# Patient Record
Sex: Female | Born: 1955 | Race: White | Hispanic: No | Smoking: Current every day smoker
Health system: Southern US, Community
[De-identification: ages and names within clinical notes are randomized; demographics above are authoritative.]

## PROBLEM LIST (undated history)

## (undated) DIAGNOSIS — F419 Anxiety disorder, unspecified: Secondary | ICD-10-CM

## (undated) DIAGNOSIS — K219 Gastro-esophageal reflux disease without esophagitis: Secondary | ICD-10-CM

## (undated) DIAGNOSIS — B182 Chronic viral hepatitis C: Secondary | ICD-10-CM

## (undated) DIAGNOSIS — I10 Essential (primary) hypertension: Secondary | ICD-10-CM

## (undated) DIAGNOSIS — J449 Chronic obstructive pulmonary disease, unspecified: Secondary | ICD-10-CM

## (undated) HISTORY — PX: VAGINAL HYSTERECTOMY: SUR661

## (undated) HISTORY — DX: Anxiety disorder, unspecified: F41.9

## (undated) HISTORY — PX: RECTOCELE REPAIR: SHX761

## (undated) HISTORY — PX: DILATION AND CURETTAGE OF UTERUS: SHX78

## (undated) HISTORY — DX: Chronic obstructive pulmonary disease, unspecified: J44.9

## (undated) HISTORY — DX: Essential (primary) hypertension: I10

## (undated) HISTORY — DX: Chronic viral hepatitis C: B18.2

---

## 2006-06-19 DIAGNOSIS — F419 Anxiety disorder, unspecified: Secondary | ICD-10-CM

## 2006-06-19 HISTORY — DX: Anxiety disorder, unspecified: F41.9

## 2014-04-16 ENCOUNTER — Other Ambulatory Visit (HOSPITAL_COMMUNITY): Payer: Self-pay | Admitting: Physician Assistant

## 2014-04-16 DIAGNOSIS — Z1231 Encounter for screening mammogram for malignant neoplasm of breast: Secondary | ICD-10-CM

## 2014-04-22 ENCOUNTER — Ambulatory Visit (HOSPITAL_COMMUNITY)
Admission: RE | Admit: 2014-04-22 | Discharge: 2014-04-22 | Disposition: A | Discharge status: Home / Self Care | Payer: Self-pay | Source: Ambulatory Visit | Attending: Physician Assistant | Admitting: Physician Assistant

## 2014-04-22 DIAGNOSIS — Z1231 Encounter for screening mammogram for malignant neoplasm of breast: Secondary | ICD-10-CM

## 2014-04-29 ENCOUNTER — Other Ambulatory Visit: Payer: Self-pay | Admitting: Physician Assistant

## 2014-04-29 DIAGNOSIS — R928 Other abnormal and inconclusive findings on diagnostic imaging of breast: Secondary | ICD-10-CM

## 2015-09-14 ENCOUNTER — Encounter: Payer: Self-pay | Admitting: Physician Assistant

## 2015-09-14 ENCOUNTER — Ambulatory Visit: Payer: Self-pay | Admitting: Physician Assistant

## 2015-09-14 VITALS — BP 144/104 | HR 68 | Temp 98.1°F | Ht 65.0 in | Wt 141.0 lb

## 2015-09-14 DIAGNOSIS — Z13220 Encounter for screening for lipoid disorders: Secondary | ICD-10-CM

## 2015-09-14 DIAGNOSIS — I10 Essential (primary) hypertension: Secondary | ICD-10-CM

## 2015-09-14 DIAGNOSIS — R928 Other abnormal and inconclusive findings on diagnostic imaging of breast: Secondary | ICD-10-CM

## 2015-09-14 DIAGNOSIS — F17210 Nicotine dependence, cigarettes, uncomplicated: Secondary | ICD-10-CM

## 2015-09-14 DIAGNOSIS — Z1211 Encounter for screening for malignant neoplasm of colon: Secondary | ICD-10-CM

## 2015-09-14 DIAGNOSIS — F419 Anxiety disorder, unspecified: Secondary | ICD-10-CM

## 2015-09-14 NOTE — Progress Notes (Signed)
BP 144/104 mmHg  Pulse 68  Temp(Src) 98.1 F (36.7 C)  Ht 5\' 5"  (1.651 m)  Wt 141 lb (63.957 kg)  BMI 23.46 kg/m2  SpO2 97%   Subjective:    Patient ID: Jody Alvarez, female    DOB: 01/18/1956, [age] y.o.   MRN: [id number]  HPI: Jody HaradaLinda Moch is a 60 y.o. female presenting on 09/14/2015 for New Patient (Initial Visit)   HPI [facility] and [facility] her [facility] doctor in November 2016 and got rx for lisinopril/hctz  Pt had abnormal mammo here in November 2015 but she never followed up.  She says she was scared at the time but she is ready now.  [facility]  She was pt there in the past.    Relevant past medical, surgical, family and social history reviewed and updated as indicated. Interim medical history since our last visit reviewed. Allergies and medications reviewed and updated.   Current outpatient prescriptions:  .  Diphenhydramine-APAP, sleep, (TYLENOL PM EXTRA STRENGTH PO), Take 3-4 tablets by mouth at bedtime., Disp: , Rfl:  .  ibuprofen (ADVIL,MOTRIN) 800 MG tablet, Take 800 mg by mouth 2 (two) times daily as needed., Disp: , Rfl:  .  lisinopril-hydrochlorothiazide (PRINZIDE,ZESTORETIC) 20-12.5 MG tablet, Take 1 tablet by mouth daily., Disp: , Rfl:  .  loperamide (IMODIUM A-D) 2 MG tablet, Take 2 mg by mouth 4 (four) times daily as needed for diarrhea or loose stools., Disp: , Rfl:    Review of Systems  Constitutional: Positive for appetite change, fatigue and unexpected weight change. Negative for fever, chills and diaphoresis.  HENT: Positive for dental problem and sneezing. Negative for congestion, drooling, ear pain, facial swelling, hearing loss, mouth sores, sore throat, trouble swallowing and voice change.   Eyes: Positive for redness. Negative for pain, discharge, itching and visual disturbance.  Respiratory: Positive for cough, shortness of breath and wheezing. Negative for choking.   Cardiovascular: Positive for palpitations. Negative  for chest pain and leg swelling.  Gastrointestinal: Positive for abdominal pain and diarrhea. Negative for vomiting, constipation and blood in stool.  Endocrine: Negative for cold intolerance, heat intolerance and polydipsia.  Genitourinary: Negative for dysuria, hematuria and decreased urine volume.  Musculoskeletal: Positive for back pain, arthralgias and gait problem.  Skin: Negative for rash.  Allergic/Immunologic: Negative for environmental allergies.  Neurological: Positive for headaches. Negative for seizures, syncope and light-headedness.  Hematological: Negative for adenopathy.  Psychiatric/Behavioral: Positive for dysphoric mood and agitation. Negative for suicidal ideas. The patient is nervous/anxious.     Per HPI unless specifically indicated above     Objective:    BP 144/104 mmHg  Pulse 68  Temp(Src) 98.1 F (36.7 C)  Ht 5\' 5"  (1.651 m)  Wt 141 lb (63.957 kg)  BMI 23.46 kg/m2  SpO2 97%  Wt Readings from Last 3 Encounters:  09/14/15 141 lb (63.957 kg)    Physical Exam  Constitutional: She is oriented to person, place, and time. She appears well-developed and well-nourished.  HENT:  Head: Normocephalic and atraumatic.  Mouth/Throat: Oropharynx is clear and moist. No oropharyngeal exudate.  Eyes: Conjunctivae and EOM are normal. Pupils are equal, round, and reactive to light.  Neck: Neck supple. No thyromegaly present.  Cardiovascular: Normal rate and regular rhythm.   Pulmonary/Chest: Effort normal and breath sounds normal.  Abdominal: Soft. Bowel sounds are normal. She exhibits no mass. There is no hepatosplenomegaly. There is no tenderness.  Musculoskeletal: She exhibits no edema.  Lymphadenopathy:    She has no cervical adenopathy.  Neurological: She is alert and oriented to person, place, and time. Gait normal.  Skin: Skin is warm and dry.  Psychiatric: She has a normal mood and affect. Her behavior is normal.  Vitals reviewed.   No results found for  this or any previous visit.    Assessment & Plan:   Encounter Diagnoses  Name Primary?  . Essential hypertension, benign Yes  . Screening cholesterol level   . Cigarette nicotine dependence without complication   . Abnormal mammogram   . Anxiety disorder, unspecified anxiety disorder type   . Special screening for malignant neoplasms, colon     -Refer to [facility] for diagnostic mammo- abn mammo 11/15 -pt to get Fasting labs -Continue  bp medication -Gave ifobt for colon cancer screening -pt to go to [facility] as scheduled for [facility] care -F/u 15month

## 2015-09-15 DIAGNOSIS — I10 Essential (primary) hypertension: Secondary | ICD-10-CM | POA: Insufficient documentation

## 2015-09-15 DIAGNOSIS — F17210 Nicotine dependence, cigarettes, uncomplicated: Secondary | ICD-10-CM | POA: Insufficient documentation

## 2015-09-15 DIAGNOSIS — F419 Anxiety disorder, unspecified: Secondary | ICD-10-CM | POA: Insufficient documentation

## 2015-09-21 ENCOUNTER — Encounter: Payer: Self-pay | Admitting: Student

## 2015-10-02 ENCOUNTER — Other Ambulatory Visit: Payer: Self-pay | Admitting: Physician Assistant

## 2015-10-02 LAB — CBC WITH DIFFERENTIAL/PLATELET
BASOS ABS: 70 {cells}/uL (ref 0–200)
BASOS PCT: 1 %
EOS ABS: 70 {cells}/uL (ref 15–500)
Eosinophils Relative: 1 %
HEMATOCRIT: 38.5 % (ref 35.0–45.0)
Hemoglobin: 12.7 g/dL (ref 11.7–15.5)
LYMPHS PCT: 39 %
Lymphs Abs: 2730 cells/uL (ref 850–3900)
MCH: 30.9 pg (ref 27.0–33.0)
MCHC: 33 g/dL (ref 32.0–36.0)
MCV: 93.7 fL (ref 80.0–100.0)
MONO ABS: 490 {cells}/uL (ref 200–950)
MPV: 9.6 fL (ref 7.5–12.5)
Monocytes Relative: 7 %
NEUTROS PCT: 52 %
Neutro Abs: 3640 cells/uL (ref 1500–7800)
PLATELETS: 262 10*3/uL (ref 140–400)
RBC: 4.11 MIL/uL (ref 3.80–5.10)
RDW: 14.1 % (ref 11.0–15.0)
WBC: 7 10*3/uL (ref 3.8–10.8)

## 2015-10-02 LAB — COMPLETE METABOLIC PANEL WITH GFR
ALT: 8 U/L (ref 6–29)
AST: 21 U/L (ref 10–35)
Albumin: 4.1 g/dL (ref 3.6–5.1)
Alkaline Phosphatase: 55 U/L (ref 33–130)
BUN: 21 mg/dL (ref 7–25)
CO2: 29 mmol/L (ref 20–31)
CREATININE: 1.43 mg/dL — AB (ref 0.50–1.05)
Calcium: 9.8 mg/dL (ref 8.6–10.4)
Chloride: 104 mmol/L (ref 98–110)
GFR, Est African American: 46 mL/min — ABNORMAL LOW (ref >=60)
GFR, Est Non African American: 40 mL/min — ABNORMAL LOW (ref >=60)
GLUCOSE: 97 mg/dL (ref 65–99)
Potassium: 3.8 mmol/L (ref 3.5–5.3)
Sodium: 142 mmol/L (ref 135–146)
TOTAL PROTEIN: 7.1 g/dL (ref 6.1–8.1)
Total Bilirubin: 0.5 mg/dL (ref 0.2–1.2)

## 2015-10-02 LAB — LIPID PANEL
CHOL/HDL RATIO: 2.7 ratio (ref <=5.0)
Cholesterol: 169 mg/dL (ref 125–200)
HDL: 63 mg/dL (ref >=46)
LDL Cholesterol: 91 mg/dL (ref <130)
TRIGLYCERIDES: 73 mg/dL (ref <150)
VLDL: 15 mg/dL (ref <30)

## 2015-10-04 ENCOUNTER — Encounter: Payer: Self-pay | Admitting: Physician Assistant

## 2015-10-04 ENCOUNTER — Ambulatory Visit: Payer: Self-pay | Admitting: Physician Assistant

## 2015-10-04 VITALS — BP 180/104 | HR 82 | Temp 98.1°F | Ht 65.0 in | Wt 142.8 lb

## 2015-10-04 DIAGNOSIS — R928 Other abnormal and inconclusive findings on diagnostic imaging of breast: Secondary | ICD-10-CM | POA: Insufficient documentation

## 2015-10-04 DIAGNOSIS — R05 Cough: Secondary | ICD-10-CM

## 2015-10-04 DIAGNOSIS — Z8619 Personal history of other infectious and parasitic diseases: Secondary | ICD-10-CM | POA: Insufficient documentation

## 2015-10-04 DIAGNOSIS — R197 Diarrhea, unspecified: Secondary | ICD-10-CM | POA: Insufficient documentation

## 2015-10-04 DIAGNOSIS — I10 Essential (primary) hypertension: Secondary | ICD-10-CM

## 2015-10-04 DIAGNOSIS — N189 Chronic kidney disease, unspecified: Secondary | ICD-10-CM

## 2015-10-04 DIAGNOSIS — F17210 Nicotine dependence, cigarettes, uncomplicated: Secondary | ICD-10-CM

## 2015-10-04 DIAGNOSIS — R059 Cough, unspecified: Secondary | ICD-10-CM

## 2015-10-04 MED ORDER — IBUPROFEN 800 MG PO TABS: 800.0000 mg | ORAL_TABLET | Freq: Two times a day (BID) | ORAL | Status: DC | PRN

## 2015-10-04 MED ORDER — LISINOPRIL 20 MG PO TABS: 20.0000 mg | ORAL_TABLET | Freq: Every day | ORAL | Status: DC

## 2015-10-04 NOTE — Patient Instructions (Addendum)
Turn in [facility] discount application Get blood drawn for hepatitis test, diarrhea test Get chest xray done Call breast program about your breast appointment-   [phone]

## 2015-10-04 NOTE — Progress Notes (Signed)
BP 180/104 mmHg  Pulse 82  Temp(Src) 98.1 F (36.7 C)  Ht _0  (1.651 m)  Wt 142 lb 12 oz (64.751 kg)  BMI 23.75 kg/m2  SpO2 97%   Subjective:    Patient ID: Jody Alvarez, female    DOB: 1955/11/27, 60 y.o.   MRN: [id number]  HPI: [name] is a 60 y.o. female presenting on 10/04/2015 for Hypertension   HPI  Chief Complaint  Patient presents with  . Hypertension    pt quit taking lisinopril/HCTZ 3 days ago bc she felt her BP went too low   Pt has says she was told she would be sent a letter with appt information with [facility] but she hasn't gotten it yet  Pt c/o cough since January. That is when she says her diarrhea started.  She uses the immodium 3-4 days/week.   Pt says she felt dizzy with the other bp meds which is why she stopped it.  Today pt says she has hepatitis. She did not share this at last OV when she had labwork ordered.  Pt requests more ibu- uses is almost daily.  Pt went [facility].  Pt got trazodone but she isn't going back there. [facility] is [facility]- has [facility] there tomorrow  Relevant past medical, surgical, family and social history reviewed and updated as indicated. Interim medical history since our last visit reviewed. Allergies and medications reviewed and updated.  CURRENT MEDS: Tylenol pm Imodium   Review of Systems  Constitutional: Positive for appetite change, fatigue and unexpected weight change. Negative for fever, chills and diaphoresis.  HENT: Positive for sneezing. Negative for congestion, dental problem, drooling, ear pain, facial swelling, hearing loss, mouth sores, sore throat, trouble swallowing and voice change.   Eyes: Positive for redness. Negative for pain, discharge and itching.  Respiratory: Positive for cough, chest tightness, shortness of breath and wheezing. Negative for choking.   Cardiovascular: Positive for leg swelling. Negative for chest pain and palpitations.  Gastrointestinal: Positive for abdominal pain and  diarrhea. Negative for vomiting, constipation and blood in stool.  Endocrine: Negative for cold intolerance, heat intolerance and polydipsia.  Genitourinary: Positive for decreased urine volume. Negative for dysuria and hematuria.  Musculoskeletal: Positive for back pain. Negative for arthralgias and gait problem.  Skin: Negative for rash.  Allergic/Immunologic: Positive for environmental allergies.  Neurological: Positive for light-headedness and headaches. Negative for seizures and syncope.  Hematological: Negative for adenopathy.  Psychiatric/Behavioral: Positive for dysphoric mood and agitation. Negative for suicidal ideas. The patient is nervous/anxious.     Per HPI unless specifically indicated above     Objective:    BP 180/104 mmHg  Pulse 82  Temp(Src) 98.1 F (36.7 C)  Ht _1  (1.651 m)  Wt 142 lb 12 oz (64.751 kg)  BMI 23.75 kg/m2  SpO2 97%  Wt Readings from Last 3 Encounters:  10/04/15 142 lb 12 oz (64.751 kg)  09/14/15 141 lb (63.957 kg)    Physical Exam  Constitutional: She is oriented to person, place, and time. She appears well-developed and well-nourished.  HENT:  Head: Normocephalic and atraumatic.  Neck: Neck supple.  Cardiovascular: Normal rate and regular rhythm.   Pulmonary/Chest: Effort normal and breath sounds normal.  Abdominal: Soft. Bowel sounds are normal. She exhibits no mass. There is no hepatosplenomegaly. There is no tenderness.  Musculoskeletal: She exhibits no edema.  Lymphadenopathy:    She has no cervical adenopathy.  Neurological: She is alert and oriented to person, place,  and time.  Skin: Skin is warm and dry.  Psychiatric: She has a normal mood and affect. Her behavior is normal.  Vitals reviewed.   Results for orders placed or performed in visit on 10/02/15  COMPLETE METABOLIC PANEL WITH GFR  Result Value Ref Range   Sodium 142 135 - 146 mmol/L   Potassium 3.8 3.5 - 5.3 mmol/L   Chloride 104 98 - 110 mmol/L   CO2 29 20 - 31  mmol/L   Glucose, Bld 97 65 - 99 mg/dL   BUN 21 7 - 25 mg/dL   Creat 1.43 (H) 0.50 - 1.05 mg/dL   Total Bilirubin 0.5 0.2 - 1.2 mg/dL   Alkaline Phosphatase 55 33 - 130 U/L   AST 21 10 - 35 U/L   ALT 8 6 - 29 U/L   Total Protein 7.1 6.1 - 8.1 g/dL   Albumin 4.1 3.6 - 5.1 g/dL   Calcium 9.8 8.6 - 10.4 mg/dL   GFR, Est African American 46 (L) >=60 mL/min   GFR, Est Non African American 40 (L) >=60 mL/min  CBC with Differential/Platelet  Result Value Ref Range   WBC 7.0 3.8 - 10.8 K/uL   RBC 4.11 3.80 - 5.10 MIL/uL   Hemoglobin 12.7 11.7 - 15.5 g/dL   HCT 38.5 35.0 - 45.0 %   MCV 93.7 80.0 - 100.0 fL   MCH 30.9 27.0 - 33.0 pg   MCHC 33.0 32.0 - 36.0 g/dL   RDW 14.1 11.0 - 15.0 %   Platelets 262 140 - 400 K/uL   MPV 9.6 7.5 - 12.5 fL   Neutro Abs 3640 1500 - 7800 cells/uL   Lymphs Abs 2730 850 - 3900 cells/uL   Monocytes Absolute 490 200 - 950 cells/uL   Eosinophils Absolute 70 15 - 500 cells/uL   Basophils Absolute 70 0 - 200 cells/uL   Neutrophils Relative % 52 %   Lymphocytes Relative 39 %   Monocytes Relative 7 %   Eosinophils Relative 1 %   Basophils Relative 1 %   Smear Review Criteria for review not met   Lipid panel  Result Value Ref Range   Cholesterol 169 125 - 200 mg/dL   Triglycerides 73 <150 mg/dL   HDL 63 >=46 mg/dL   Total CHOL/HDL Ratio 2.7 <=5.0 Ratio   VLDL 15 <30 mg/dL   LDL Cholesterol 91 <130 mg/dL      Assessment & Plan:   Encounter Diagnoses  Name Primary?  . Essential hypertension, benign Yes  . Cigarette nicotine dependence without complication   . Cough   . History of hepatitis   . Diarrhea, unspecified type   . Abnormal mammogram   . CKD (chronic kidney disease), unspecified stage     -Reviewed labs with pt -pt given number to call [facility] to see when pt's appointment is -pt counseled to Get on bp meds to prevent worsening kidney function or other damage due to uncontrolled hypertension -Put pt on lisinopril 20- stop the hctz part -Pt  says iFOBT was mailed within a week after last appt it has not yet been received by clinic -Pt needs cxr in light of smoking history and cough- gave [facility] discount application -counseled on smoking cessation -F/u 1 month to recheck bp

## 2015-10-05 LAB — IFOBT (OCCULT BLOOD): IFOBT: POSITIVE

## 2015-10-12 ENCOUNTER — Ambulatory Visit (HOSPITAL_COMMUNITY)
Admission: RE | Admit: 2015-10-12 | Discharge: 2015-10-12 | Disposition: A | Discharge status: Home / Self Care | Payer: Self-pay | Source: Ambulatory Visit | Attending: Physician Assistant | Admitting: Physician Assistant

## 2015-10-12 DIAGNOSIS — F17210 Nicotine dependence, cigarettes, uncomplicated: Secondary | ICD-10-CM | POA: Insufficient documentation

## 2015-10-12 DIAGNOSIS — R05 Cough: Secondary | ICD-10-CM | POA: Insufficient documentation

## 2015-10-14 LAB — HEPATITIS PANEL, ACUTE
HCV Ab: REACTIVE — AB
HEP A IGM: NONREACTIVE
HEP B S AG: NEGATIVE
Hep B C IgM: NONREACTIVE

## 2015-10-15 LAB — HEPATITIS C RNA QUANTITATIVE
HCV QUANT LOG: 5.98 {Log} — AB (ref <1.18)
HCV QUANT: 944804 [IU]/mL — AB (ref <15)

## 2015-10-28 ENCOUNTER — Other Ambulatory Visit (HOSPITAL_COMMUNITY): Payer: Self-pay | Admitting: *Deleted

## 2015-10-28 DIAGNOSIS — N6489 Other specified disorders of breast: Secondary | ICD-10-CM

## 2015-11-01 ENCOUNTER — Encounter: Payer: Self-pay | Admitting: Physician Assistant

## 2015-11-01 ENCOUNTER — Ambulatory Visit: Payer: Self-pay | Admitting: Physician Assistant

## 2015-11-01 VITALS — BP 186/122 | HR 83 | Temp 98.1°F | Ht 65.0 in | Wt 144.0 lb

## 2015-11-01 DIAGNOSIS — I10 Essential (primary) hypertension: Secondary | ICD-10-CM

## 2015-11-01 DIAGNOSIS — R195 Other fecal abnormalities: Secondary | ICD-10-CM | POA: Insufficient documentation

## 2015-11-01 DIAGNOSIS — F17219 Nicotine dependence, cigarettes, with unspecified nicotine-induced disorders: Secondary | ICD-10-CM | POA: Insufficient documentation

## 2015-11-01 DIAGNOSIS — B182 Chronic viral hepatitis C: Secondary | ICD-10-CM

## 2015-11-01 DIAGNOSIS — R197 Diarrhea, unspecified: Secondary | ICD-10-CM

## 2015-11-01 DIAGNOSIS — J449 Chronic obstructive pulmonary disease, unspecified: Secondary | ICD-10-CM

## 2015-11-01 DIAGNOSIS — N189 Chronic kidney disease, unspecified: Secondary | ICD-10-CM

## 2015-11-01 MED ORDER — METOPROLOL TARTRATE 50 MG PO TABS: 50.0000 mg | ORAL_TABLET | Freq: Two times a day (BID) | ORAL | Status: DC

## 2015-11-01 NOTE — Progress Notes (Signed)
BP 186/122 mmHg  Pulse 83  Temp(Src) 98.1 F (36.7 C)  Ht  (1.651 m)  Wt 144 lb (65.318 kg)  BMI 23.96 kg/m2  SpO2 99%   Subjective:    Patient ID: Jody Alvarez, female    DOB: 03/04/1956, 60 y.o.   MRN: [id number]  HPI: Jody Alvarez is a 60 y.o. female presenting on 11/01/2015 for Hypertension; Cough; and Diarrhea   HPI  Pt says she has f/u with [facility] scheduled  [facility] is [facility]  Pt still having diarrhea- she takes imodium 2-4 times/week  Pt did not take stool to lab for testing (c-dif, culture and o&p)  Pt not currently having cp, HA, vision changes   Relevant past medical, surgical, family and social history reviewed and updated as indicated. Interim medical history since our last visit reviewed. Allergies and medications reviewed and updated.  Current outpatient prescriptions:  .  Diphenhydramine-APAP, sleep, (TYLENOL PM EXTRA STRENGTH PO), Take 3-4 tablets by mouth at bedtime as needed. , Disp: , Rfl:  .  ibuprofen (ADVIL,MOTRIN) 800 MG tablet, Take 1 tablet (800 mg total) by mouth every 12 (twelve) hours as needed. Reported on 10/04/2015, Disp: 60 tablet, Rfl: 1 .  lisinopril (PRINIVIL,ZESTRIL) 20 MG tablet, Take 1 tablet (20 mg total) by mouth daily., Disp: 30 tablet, Rfl: 2 .  loperamide (IMODIUM A-D) 2 MG tablet, Take 2 mg by mouth 4 (four) times daily as needed for diarrhea or loose stools., Disp: , Rfl:    Review of Systems  Constitutional: Positive for diaphoresis and fatigue. Negative for fever, chills, appetite change and unexpected weight change.  HENT: Positive for dental problem, hearing loss and sneezing. Negative for congestion, ear pain, facial swelling, mouth sores, sore throat, trouble swallowing and voice change.   Eyes: Positive for redness. Negative for pain, discharge and itching.  Respiratory: Positive for cough, chest tightness, shortness of breath and wheezing. Negative for choking.   Cardiovascular: Positive for  palpitations. Negative for chest pain and leg swelling.  Gastrointestinal: Positive for abdominal pain and diarrhea. Negative for vomiting, constipation and blood in stool.  Endocrine: Negative for cold intolerance, heat intolerance and polydipsia.  Genitourinary: Negative for dysuria, hematuria and decreased urine volume.  Musculoskeletal: Positive for back pain and arthralgias. Negative for gait problem.  Skin: Negative for rash.  Allergic/Immunologic: Negative for environmental allergies.  Neurological: Negative for seizures, syncope, light-headedness and headaches.  Hematological: Negative for adenopathy.  Psychiatric/Behavioral: Positive for dysphoric mood and agitation. Negative for suicidal ideas. The patient is nervous/anxious.     Per HPI unless specifically indicated above     Objective:    BP 186/122 mmHg  Pulse 83  Temp(Src) 98.1 F (36.7 C)  Ht  (1.651 m)  Wt 144 lb (65.318 kg)  BMI 23.96 kg/m2  SpO2 99%  Wt Readings from Last 3 Encounters:  11/01/15 144 lb (65.318 kg)  10/04/15 142 lb 12 oz (64.751 kg)  09/14/15 141 lb (63.957 kg)    Physical Exam  Constitutional: She is oriented to person, place, and time. She appears well-developed and well-nourished.  HENT:  Head: Normocephalic and atraumatic.  Neck: Neck supple.  Cardiovascular: Normal rate and regular rhythm.   Pulmonary/Chest: Effort normal and breath sounds normal.  Abdominal: Soft. Bowel sounds are normal. She exhibits no mass. There is no hepatosplenomegaly. There is no tenderness.  Musculoskeletal: She exhibits no edema.  Lymphadenopathy:    She has no cervical adenopathy.  Neurological: She  is alert and oriented to person, place, and time.  Skin: Skin is warm and dry.  Psychiatric: She has a normal mood and affect. Her behavior is normal.  Vitals reviewed.   Results for orders placed or performed in visit on 10/04/15  Hepatitis, Acute  Result Value Ref Range   Hepatitis B Surface Ag  NEGATIVE NEGATIVE   HCV Ab REACTIVE (A) NEGATIVE   Hep B C IgM NON REACTIVE NON REACTIVE   Hep A IgM NON REACTIVE NON REACTIVE  Hepatitis C RNA quantitative  Result Value Ref Range   HCV Quantitative 944804 (H) <15 IU/mL   HCV Quantitative Log 5.98 (H) <1.18 log 10      Assessment & Plan:   Encounter Diagnoses  Name Primary?  . Essential hypertension, benign Yes  . Diarrhea, unspecified type   . CKD (chronic kidney disease), unspecified stage   . Chronic hepatitis C without hepatic coma (HCC)   . Cigarette nicotine dependence with nicotine-induced disorder   . Fecal occult blood test positive   . Chronic obstructive pulmonary disease, unspecified COPD type (HCC)     -Pt says she turned in her [facility] discount application before she had her cxr -reviewed cxr report with pt -Refer to GI for hepatitis and + iFOBT -counseled smoking cessation -Sign up for medassist and order albuterol MDI -Add metoprolol for bp -f/u 2 wk to recheck bp.  RTO sooner prn

## 2015-11-02 ENCOUNTER — Encounter (HOSPITAL_COMMUNITY): Payer: Self-pay

## 2015-11-02 ENCOUNTER — Other Ambulatory Visit: Payer: Self-pay | Admitting: Physician Assistant

## 2015-11-02 MED ORDER — ALBUTEROL SULFATE HFA 108 (90 BASE) MCG/ACT IN AERS: 2.0000 | INHALATION_SPRAY | Freq: Four times a day (QID) | RESPIRATORY_TRACT | Status: AC | PRN

## 2015-11-03 ENCOUNTER — Encounter: Payer: Self-pay | Admitting: Gastroenterology

## 2015-11-16 ENCOUNTER — Other Ambulatory Visit (HOSPITAL_COMMUNITY): Payer: Self-pay | Admitting: *Deleted

## 2015-11-16 ENCOUNTER — Encounter: Payer: Self-pay | Admitting: Physician Assistant

## 2015-11-16 ENCOUNTER — Ambulatory Visit (HOSPITAL_COMMUNITY)
Admission: RE | Admit: 2015-11-16 | Discharge: 2015-11-16 | Disposition: A | Discharge status: Home / Self Care | Payer: PRIVATE HEALTH INSURANCE | Source: Ambulatory Visit | Attending: *Deleted | Admitting: *Deleted

## 2015-11-16 ENCOUNTER — Encounter (HOSPITAL_COMMUNITY): Payer: PRIVATE HEALTH INSURANCE

## 2015-11-16 ENCOUNTER — Ambulatory Visit: Payer: Self-pay | Admitting: Physician Assistant

## 2015-11-16 ENCOUNTER — Ambulatory Visit (HOSPITAL_COMMUNITY): Payer: PRIVATE HEALTH INSURANCE

## 2015-11-16 VITALS — BP 200/118 | HR 53 | Temp 98.1°F | Ht 65.0 in | Wt 144.4 lb

## 2015-11-16 DIAGNOSIS — F17219 Nicotine dependence, cigarettes, with unspecified nicotine-induced disorders: Secondary | ICD-10-CM

## 2015-11-16 DIAGNOSIS — I10 Essential (primary) hypertension: Secondary | ICD-10-CM

## 2015-11-16 DIAGNOSIS — N6489 Other specified disorders of breast: Secondary | ICD-10-CM | POA: Diagnosis present

## 2015-11-16 MED ORDER — METOPROLOL TARTRATE 100 MG PO TABS: 100.0000 mg | ORAL_TABLET | Freq: Two times a day (BID) | ORAL | Status: DC

## 2015-11-16 NOTE — Progress Notes (Signed)
BP 200/118 mmHg  Pulse 53  Temp(Src) 98.1 F (36.7 C)  Ht 5\' 5"  (1.651 m)  Wt 144 lb 6.4 oz (65.499 kg)  BMI 24.03 kg/m2  SpO2 98%   Subjective:    Patient ID: Jody Alvarez Nerio, female    DOB: 01/24/1956, 60 y.o.   MRN: [id number]  HPI: Jody Alvarez Headlee is a 60 y.o. female presenting on 11/16/2015 for Hypertension   HPI   Pt is feeling well today.  She says she has only been on the metoprolol for about 2 weeks.  Says she has been busy today and thinks that is why bp up today.   Relevant past medical, surgical, family and social history reviewed and updated as indicated. Interim medical history since our last visit reviewed. Allergies and medications reviewed and updated.  Current outpatient prescriptions:  .  albuterol (PROVENTIL HFA;VENTOLIN HFA) 108 (90 Base) MCG/ACT inhaler, Inhale 2 puffs into the lungs every 6 (six) hours as needed for wheezing or shortness of breath., Disp: 3 Inhaler, Rfl: 0 .  Diphenhydramine-APAP, sleep, (TYLENOL PM EXTRA STRENGTH PO), Take 3-4 tablets by mouth at bedtime as needed. , Disp: , Rfl:  .  ibuprofen (ADVIL,MOTRIN) 800 MG tablet, Take 1 tablet (800 mg total) by mouth every 12 (twelve) hours as needed. Reported on 10/04/2015, Disp: 60 tablet, Rfl: 1 .  lisinopril (PRINIVIL,ZESTRIL) 20 MG tablet, Take 1 tablet (20 mg total) by mouth daily., Disp: 30 tablet, Rfl: 2 .  loperamide (IMODIUM A-D) 2 MG tablet, Take 2 mg by mouth 4 (four) times daily as needed for diarrhea or loose stools., Disp: , Rfl:  .  metoprolol (LOPRESSOR) 50 MG tablet, Take 1 tablet (50 mg total) by mouth 2 (two) times daily., Disp: 60 tablet, Rfl: 1   Review of Systems  Constitutional: Positive for fatigue. Negative for fever, chills, diaphoresis, appetite change and unexpected weight change.  HENT: Positive for sneezing. Negative for congestion, dental problem, drooling, ear pain, facial swelling, hearing loss, mouth sores, sore throat, trouble swallowing and voice change.   Eyes:  Negative for pain, discharge, redness, itching and visual disturbance.  Respiratory: Positive for cough, shortness of breath and wheezing. Negative for choking.   Cardiovascular: Negative for chest pain, palpitations and leg swelling.  Gastrointestinal: Positive for diarrhea. Negative for vomiting, abdominal pain, constipation and blood in stool.  Endocrine: Negative for cold intolerance, heat intolerance and polydipsia.  Genitourinary: Negative for dysuria, hematuria and decreased urine volume.  Musculoskeletal: Negative for back pain, arthralgias and gait problem.  Skin: Negative for rash.  Allergic/Immunologic: Negative for environmental allergies.  Neurological: Negative for seizures, syncope, light-headedness and headaches.  Hematological: Negative for adenopathy.  Psychiatric/Behavioral: Positive for dysphoric mood and agitation. Negative for suicidal ideas. The patient is nervous/anxious.     Per HPI unless specifically indicated above     Objective:    BP 200/118 mmHg  Pulse 53  Temp(Src) 98.1 F (36.7 C)  Ht 5\' 5"  (1.651 m)  Wt 144 lb 6.4 oz (65.499 kg)  BMI 24.03 kg/m2  SpO2 98%  Wt Readings from Last 3 Encounters:  11/16/15 144 lb 6.4 oz (65.499 kg)  11/01/15 144 lb (65.318 kg)  10/04/15 142 lb 12 oz (64.751 kg)    Physical Exam  Constitutional: She is oriented to person, place, and time. She appears well-developed and well-nourished.  HENT:  Head: Normocephalic and atraumatic.  Neck: Neck supple.  Cardiovascular: Normal rate and regular rhythm.   Pulmonary/Chest: Effort normal and breath sounds normal.  Abdominal: Soft. Bowel sounds are normal. She exhibits no mass. There is no hepatosplenomegaly. There is no tenderness.  Musculoskeletal: She exhibits no edema.  Lymphadenopathy:    She has no cervical adenopathy.  Neurological: She is alert and oriented to person, place, and time.  Skin: Skin is warm and dry.  Psychiatric: She has a normal mood and affect. Her  behavior is normal.  Vitals reviewed.       Assessment & Plan:   Encounter Diagnoses  Name Primary?  . Essential hypertension, benign Yes  . Cigarette nicotine dependence with nicotine-induced disorder     -Increase metoprolol  -Continue lisinopril -counseled on smoking cessation -F/u 3 weeks to recheck BP.  Pt counseled to go to ER for HA, CP or vision changes in light of significantly elevated BP.  She is also aware of s/s CVA and will go to ER if she has any of those as well

## 2015-11-23 ENCOUNTER — Encounter: Payer: Self-pay | Admitting: Gastroenterology

## 2015-11-23 ENCOUNTER — Ambulatory Visit (INDEPENDENT_AMBULATORY_CARE_PROVIDER_SITE_OTHER): Payer: Self-pay | Admitting: Gastroenterology

## 2015-11-23 VITALS — BP 184/120 | HR 53 | Temp 99.3°F | Ht 65.0 in | Wt 142.4 lb

## 2015-11-23 DIAGNOSIS — R197 Diarrhea, unspecified: Secondary | ICD-10-CM

## 2015-11-23 DIAGNOSIS — R195 Other fecal abnormalities: Secondary | ICD-10-CM

## 2015-11-23 DIAGNOSIS — R109 Unspecified abdominal pain: Secondary | ICD-10-CM | POA: Insufficient documentation

## 2015-11-23 DIAGNOSIS — R1084 Generalized abdominal pain: Secondary | ICD-10-CM

## 2015-11-23 DIAGNOSIS — B182 Chronic viral hepatitis C: Secondary | ICD-10-CM

## 2015-11-23 NOTE — Patient Instructions (Signed)
1. Please collect stool and have labs done. 2. Abdominal ultrasound as scheduled. 3. Read over [facility] information booklet. 4. Once your results come in, we'll plan on colonoscopy and possible upper endoscopy if needed. 5. Do not share razor blades, nail clippers, toothbrush. Use barrier protection with sexual activity. 6. Your son should be screened for hepatitis C.

## 2015-11-23 NOTE — Progress Notes (Addendum)
REVIEWED-NO ADDITIONAL RECOMMENDATIONS.  Primary Care Physician:  Jacquelin HawkingShannon McElroy, PA-C  Primary Gastroenterologist:  Jonette EvaSandi Fields, MD   Chief Complaint  Patient presents with  . Abdominal Pain  . Blood In Stools  . Hep C    HPI:  Jody HaradaLinda Alvarez is a 60 y.o. female here For further evaluation of chronic hepatitis C, heme positive stool, abdominal pain at the request of Jacquelin HawkingShannon McElroy, PA-C with [facility].  Patient states she was told she had hepatitis back in the 70s. She was using IV drugs at the time. Several people were jaundiced. Reports nobody ever mentioned anything since then about viral hepatitis. She has one son who was born in the 5280s. 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 Reports no IV or intranasal drug use since the 1970s. 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 HIV negative about 6 years ago. She reports her ex contracted HCV from her. She has never been offered hepatitis C treatment.  She complains of 6 month history of diarrhea. Uses Imodium as needed. Stools loose postprandially. Associated urgency. Occasional bright red blood per rectum. Several BMs daily. PCP recommended stool studies but she never was able to collect them. Back in April she had black stools, heme positive. This resolved. No heartburn/indigestion, dysphagia. She complains of abdominal pain, knots, and the abdomen. Location varies. Massage and it helps. No better or no worse with meals or bowel movements. None present today. Denies dysuria or hematuria. Takes frequent ibuprofen for headache. Usually 2 daily. Reports 15 pound weight loss over the last year.  Recent issues with her hypertension. Just added second antihypertensive medication a few days ago.  Current Outpatient Prescriptions  Medication Sig Dispense Refill  . albuterol (PROVENTIL HFA;VENTOLIN HFA) 108 (90 Base) MCG/ACT inhaler Inhale 2 puffs into the lungs every  6 (six) hours as needed for wheezing or shortness of breath. 3 Inhaler 0  . Diphenhydramine-APAP, sleep, (TYLENOL PM EXTRA STRENGTH PO) Take 3-4 tablets by mouth at bedtime as needed.     Marland Kitchen. ibuprofen (ADVIL,MOTRIN) 800 MG tablet Take 1 tablet (800 mg total) by mouth every 12 (twelve) hours as needed. Reported on 10/04/2015 60 tablet 1  . lisinopril (PRINIVIL,ZESTRIL) 20 MG tablet Take 1 tablet (20 mg total) by mouth daily. 30 tablet 2  . loperamide (IMODIUM A-D) 2 MG tablet Take 2 mg by mouth 4 (four) times daily as needed for diarrhea or loose stools.    . metoprolol (LOPRESSOR) 100 MG tablet Take 1 tablet (100 mg total) by mouth 2 (two) times daily. 60 tablet 1   No current facility-administered medications for this visit.    Allergies as of 11/23/2015 - Review Complete 11/23/2015  Allergen Reaction Noted  . Trazodone and nefazodone Anxiety and Palpitations 09/14/2015  . Flexeril [cyclobenzaprine] Other (See Comments) 10/04/2015  . Phenergan [promethazine hcl] Other (See Comments) 10/04/2015    Past Medical History  Diagnosis Date  . Hypertension   . Anxiety 2008    lost 4 close family members in 4 yrs, "doesn't like to leave house"  . Anxiety     [facility], has been in past    Past Surgical History  Procedure Laterality Date  . Rectocele repair    . Vaginal hysterectomy      Family History  Problem Relation Age of Onset  . Diabetes Mother   . Cancer Mother  uterine  . Cancer Father   . Cancer Sister     pancreatic  . Cancer Maternal Grandfather     lung  . Cancer Sister     cervical  . Cancer Sister     cervical    Social History   Social History  . Marital Status: Divorced    Spouse Name: N/A  . Number of Children: 1  . Years of Education: N/A   Occupational History  . unemployed    Social History Main Topics  . Smoking status: Current Every Day Smoker -- 1.00 packs/day for 50 years    Types: Cigarettes  .  Smokeless tobacco: Never Used  . Alcohol Use: No  . Drug Use: No     Comment: 1990's -2006, addicted to prescribed pain meds, 1970s (IV drugs, jaundiced)  . Sexual Activity:    Partners: Male   Other Topics Concern  . Not on file   Social History Narrative      ROS:  General: Negative for anorexia,  fever, chills, fatigue, weakness.See history of present illness Eyes: Negative for vision changes.  ENT: Negative for hoarseness, difficulty swallowing , nasal congestion. CV: Negative for chest pain, angina, palpitations, dyspnea on exertion, peripheral edema.  Respiratory: Negative for dyspnea at rest, dyspnea on exertion, cough, sputum, wheezing.  GI: See history of present illness. GU:  Negative for dysuria, hematuria, urinary incontinence, urinary frequency, nocturnal urination.  MS: Negative for joint pain, low back pain.  Derm: Negative for rash or itching.  Neuro: Negative for weakness, abnormal sensation, seizure,  , memory loss, confusion. See history of present illness Psych: Positive for anxiety, depression, negative for suicidal ideation, hallucinations.  Endo: See history of present illness  Heme: Negative for bruising or bleeding. Allergy: Negative for rash or hives.    Physical Examination:  BP 214/116 mmHg  Pulse 53  Temp(Src) 99.3 F (37.4 C)  Ht 5\' 5"  (1.651 m)  Wt 142 lb 6.4 oz (64.592 kg)  BMI 23.70 kg/m2   General: Well-nourished, well-developed in no acute distress.  Head: Normocephalic, atraumatic.   Eyes: Conjunctiva pink, no icterus. Mouth: Oropharyngeal mucosa moist and pink , no lesions erythema or exudate. Neck: Supple without thyromegaly, masses, or lymphadenopathy.  Lungs: Clear to auscultation bilaterally.  Heart: Regular rate and rhythm, no murmurs rubs or gallops.  Abdomen: Bowel sounds are normal, nontender, nondistended, no hepatosplenomegaly or masses, no abdominal bruits or    hernia , no rebound or guarding.   Rectal: Not  performed Extremities: No lower extremity edema. No clubbing or deformities.  Neuro: Alert and oriented x 4 , grossly normal neurologically.  Skin: Warm and dry, no rash or jaundice.   Psych: Alert and cooperative, normal mood and affect.  Labs: Lab Results  Component Value Date   CREATININE 1.43* 10/02/2015   BUN 21 10/02/2015   NA 142 10/02/2015   K 3.8 10/02/2015   CL 104 10/02/2015   CO2 29 10/02/2015   Lab Results  Component Value Date   ALT 8 10/02/2015   AST 21 10/02/2015   ALKPHOS 55 10/02/2015   BILITOT 0.5 10/02/2015   Lab Results  Component Value Date   WBC 7.0 10/02/2015   HGB 12.7 10/02/2015   HCT 38.5 10/02/2015   MCV 93.7 10/02/2015   PLT 262 10/02/2015   HCV RNA 10/12/15, 944,804 Hep B surf ag, neg Hep B C IgM, NR Hep A IgM, NR  Imaging Studies: US Breast Ltd Uni Left Inc Axilla  11/16/2015  CLINICAL DATA:  Left breast focal asymmetry seen on screening mammography in 2015. Patient did not return for additional views prior to today. EXAM: 2D DIGITAL DIAGNOSTIC BILATERAL MAMMOGRAM WITH CAD AND ADJUNCT TOMO ULTRASOUND LEFT BREAST COMPARISON:  Previous exam(s). ACR Breast Density Category c: The breast tissue is heterogeneously dense, which may obscure small masses. FINDINGS: Mammographically, there are no suspicious masses, areas of architectural distortion or microcalcifications in either breast. Mammographic images were processed with CAD. On physical exam, no suspicious masses are found. Targeted ultrasound of the left breast is performed, showing no suspicious masses or shadowing lesions. IMPRESSION: No mammographic or sonographic evidence of malignancy in either breast. RECOMMENDATION: Screening mammogram in one year.(Code:SM-B-01Y) I have discussed the findings and recommendations with the patient. Results were also provided in writing at the conclusion of the visit. If applicable, a reminder letter will be sent to the patient regarding the next appointment.  BI-RADS CATEGORY  1: Negative. Electronically Signed   By: Ted Mcalpine M.D.   On: 11/16/2015 13:53   Ms Digital Diag Tomo Bilat  11/16/2015  CLINICAL DATA:  Left breast focal asymmetry seen on screening mammography in 2015. Patient did not return for additional views prior to today. EXAM: 2D DIGITAL DIAGNOSTIC BILATERAL MAMMOGRAM WITH CAD AND ADJUNCT TOMO ULTRASOUND LEFT BREAST COMPARISON:  Previous exam(s). ACR Breast Density Category c: The breast tissue is heterogeneously dense, which may obscure small masses. FINDINGS: Mammographically, there are no suspicious masses, areas of architectural distortion or microcalcifications in either breast. Mammographic images were processed with CAD. On physical exam, no suspicious masses are found. Targeted ultrasound of the left breast is performed, showing no suspicious masses or shadowing lesions. IMPRESSION: No mammographic or sonographic evidence of malignancy in either breast. RECOMMENDATION: Screening mammogram in one year.(Code:SM-B-01Y) I have discussed the findings and recommendations with the patient. Results were also provided in writing at the conclusion of the visit. If applicable, a reminder letter will be sent to the patient regarding the next appointment. BI-RADS CATEGORY  1: Negative. Electronically Signed   By: Ted Mcalpine M.D.   On: 11/16/2015 13:53

## 2015-11-24 NOTE — Progress Notes (Signed)
CC'ED TO PCP 

## 2015-11-24 NOTE — Assessment & Plan Note (Signed)
60 year old female with history of IV drug use back in the 1970s with episode of jaundice at that time who presents with confirmed chronic hepatitis C with positive HCV RNA. No prior treatment. Genotype unknown. No available liver imaging today. Patient has been free from illegal drug use since the 8070s. History of prescription drug use, clean since 2006. No alcohol history. Discussed modes of transmission of hepatitis C, natural history and progression of the disease. Patient is interested in pursuing treatment. Labs and ultrasound with elastography ordered. Discussed potential HCV treatment, need to be consistent with taking medication regularly, compliance of of most importance, notify us with any medication changes what she is started on treatment. Patient voiced understanding.

## 2015-11-24 NOTE — Assessment & Plan Note (Signed)
Patient reports migratory abdominal "knots" with associated pain. None seen today on exam. Doubt hernia. Plan on abdominal ultrasound for further evaluation of chronic hepatitis C. Patient also with 6 month history of chronic diarrhea, melena, heme positive stool. She will complete stool for GI pathogen panel. Once her results are all back, plan on colonoscopy and probable upper endoscopy with propofol (given history of substance abuse in the past).  I have discussed the risks, alternatives, benefits with regards to but not limited to the risk of reaction to medication, bleeding, infection, perforation and the patient is agreeable to proceed. Written consent to be obtained.  We will schedule once results are available.

## 2015-11-29 ENCOUNTER — Ambulatory Visit (HOSPITAL_COMMUNITY): Admission: RE | Admit: 2015-11-29 | Payer: Self-pay | Source: Ambulatory Visit | Admitting: Gastroenterology

## 2015-12-02 ENCOUNTER — Other Ambulatory Visit (HOSPITAL_COMMUNITY)
Admission: RE | Admit: 2015-12-02 | Discharge: 2015-12-02 | Disposition: A | Discharge status: Home / Self Care | Payer: Self-pay | Source: Ambulatory Visit | Attending: Gastroenterology | Admitting: Gastroenterology

## 2015-12-02 ENCOUNTER — Ambulatory Visit (HOSPITAL_COMMUNITY)
Admission: RE | Admit: 2015-12-02 | Discharge: 2015-12-02 | Disposition: A | Discharge status: Home / Self Care | Payer: Self-pay | Source: Ambulatory Visit | Attending: Gastroenterology | Admitting: Gastroenterology

## 2015-12-02 DIAGNOSIS — R195 Other fecal abnormalities: Secondary | ICD-10-CM

## 2015-12-02 DIAGNOSIS — K829 Disease of gallbladder, unspecified: Secondary | ICD-10-CM | POA: Insufficient documentation

## 2015-12-02 DIAGNOSIS — R197 Diarrhea, unspecified: Secondary | ICD-10-CM

## 2015-12-02 DIAGNOSIS — B182 Chronic viral hepatitis C: Secondary | ICD-10-CM | POA: Insufficient documentation

## 2015-12-02 DIAGNOSIS — R1084 Generalized abdominal pain: Secondary | ICD-10-CM | POA: Insufficient documentation

## 2015-12-02 DIAGNOSIS — I77811 Abdominal aortic ectasia: Secondary | ICD-10-CM | POA: Insufficient documentation

## 2015-12-02 DIAGNOSIS — K8020 Calculus of gallbladder without cholecystitis without obstruction: Secondary | ICD-10-CM | POA: Insufficient documentation

## 2015-12-02 LAB — PROTIME-INR
INR: 1 (ref 0.00–1.49)
Prothrombin Time: 13.4 seconds (ref 11.6–15.2)

## 2015-12-02 LAB — COMPREHENSIVE METABOLIC PANEL
ALK PHOS: 58 U/L (ref 38–126)
ALT: 18 U/L (ref 14–54)
ANION GAP: 6 (ref 5–15)
AST: 30 U/L (ref 15–41)
Albumin: 4.1 g/dL (ref 3.5–5.0)
BILIRUBIN TOTAL: 0.6 mg/dL (ref 0.3–1.2)
BUN: 32 mg/dL — ABNORMAL HIGH (ref 6–20)
CALCIUM: 9.5 mg/dL (ref 8.9–10.3)
CO2: 32 mmol/L (ref 22–32)
CREATININE: 1.31 mg/dL — AB (ref 0.44–1.00)
Chloride: 99 mmol/L — ABNORMAL LOW (ref 101–111)
GFR calc non Af Amer: 44 mL/min — ABNORMAL LOW (ref >60)
GFR, EST AFRICAN AMERICAN: 51 mL/min — AB (ref >60)
Glucose, Bld: 119 mg/dL — ABNORMAL HIGH (ref 65–99)
Potassium: 3.9 mmol/L (ref 3.5–5.1)
Sodium: 137 mmol/L (ref 135–145)
TOTAL PROTEIN: 7.6 g/dL (ref 6.5–8.1)

## 2015-12-03 LAB — HEPATITIS B CORE ANTIBODY, TOTAL: HEP B C TOTAL AB: POSITIVE — AB

## 2015-12-03 LAB — HIV ANTIBODY (ROUTINE TESTING W REFLEX): HIV SCREEN 4TH GENERATION: NONREACTIVE

## 2015-12-03 LAB — HEPATITIS A ANTIBODY, TOTAL: Hep A Total Ab: POSITIVE — AB

## 2015-12-03 NOTE — Progress Notes (Signed)
Quick Note:  Can we add on Hep B surface Ab? ______

## 2015-12-05 LAB — HEPATITIS C GENOTYPE

## 2015-12-06 NOTE — Progress Notes (Signed)
Quick Note:  I called [facility] and spoke to YahooJennifer W who took the message to add the Hep B Surface Antibody. She said I will be called to let know if they were able to add or if will need a new specimen. ______

## 2015-12-07 ENCOUNTER — Encounter: Payer: Self-pay | Admitting: Physician Assistant

## 2015-12-07 ENCOUNTER — Ambulatory Visit: Payer: Self-pay | Admitting: Physician Assistant

## 2015-12-07 ENCOUNTER — Other Ambulatory Visit: Payer: Self-pay

## 2015-12-07 ENCOUNTER — Other Ambulatory Visit (HOSPITAL_COMMUNITY)
Admission: RE | Admit: 2015-12-07 | Discharge: 2015-12-07 | Disposition: A | Discharge status: Home / Self Care | Payer: Self-pay | Source: Other Acute Inpatient Hospital | Attending: Gastroenterology | Admitting: Gastroenterology

## 2015-12-07 VITALS — BP 190/120 | HR 64 | Temp 99.0°F | Ht 65.0 in | Wt 151.8 lb

## 2015-12-07 DIAGNOSIS — R195 Other fecal abnormalities: Secondary | ICD-10-CM | POA: Insufficient documentation

## 2015-12-07 DIAGNOSIS — B182 Chronic viral hepatitis C: Secondary | ICD-10-CM

## 2015-12-07 DIAGNOSIS — I10 Essential (primary) hypertension: Secondary | ICD-10-CM

## 2015-12-07 DIAGNOSIS — I77819 Aortic ectasia, unspecified site: Secondary | ICD-10-CM

## 2015-12-07 LAB — HEPATITIS B SURFACE ANTIBODY,QUALITATIVE: Hep B S Ab: NONREACTIVE

## 2015-12-07 LAB — GASTROINTESTINAL PANEL BY PCR, STOOL (REPLACES STOOL CULTURE)

## 2015-12-07 MED ORDER — LISINOPRIL 20 MG PO TABS: 40.0000 mg | ORAL_TABLET | Freq: Every day | ORAL | Status: DC

## 2015-12-07 NOTE — Progress Notes (Signed)
Quick Note:  Pt is aware. Routing to Jody PikesSusan to nic the US of Aorta in 5 years to f/u on ectatic abd aorta. ______

## 2015-12-07 NOTE — Progress Notes (Signed)
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   #1 She need u/s of aorta in 5 years to f/u on ectatic abd aorta, please NIC. #2 Await pending labs. ______

## 2015-12-07 NOTE — Progress Notes (Signed)
BP 190/120 mmHg  Pulse 64  Temp(Src) 99 F (37.2 C)  Ht  (1.651 m)  Wt 151 lb 12 oz (68.833 kg)  BMI 25.25 kg/m2  SpO2 98%   Subjective:    Patient ID: Jody Alvarez, female    DOB: [birth year], 60 y.o.   MRN: [id number]  HPI: Jody Alvarez is a 60 y.o. female presenting on 12/07/2015 for Hypertension   HPI   Pt is feeling well today.  She denies HA, cp, vision changes.  She says she went to nephrologist about 10years ago b/c she had difficult to control HTN.  She says they didn't find anything.  Reviewed and discussed US done by GI.  She will need repeat of this in 5 years for evaluation for aneurysm.  Relevant past medical, surgical, family and social history reviewed and updated as indicated. Interim medical history since our last visit reviewed. Allergies and medications reviewed and updated.  Current outpatient prescriptions:  .  albuterol (PROVENTIL HFA;VENTOLIN HFA) 108 (90 Base) MCG/ACT inhaler, Inhale 2 puffs into the lungs every 6 (six) hours as needed for wheezing or shortness of breath., Disp: 3 Inhaler, Rfl: 0 .  amitriptyline (ELAVIL) 25 MG tablet, Take 25 mg by mouth at bedtime., Disp: , Rfl:  .  Diphenhydramine-APAP, sleep, (TYLENOL PM EXTRA STRENGTH PO), Take 3-4 tablets by mouth at bedtime as needed. , Disp: , Rfl:  .  ibuprofen (ADVIL,MOTRIN) 800 MG tablet, Take 1 tablet (800 mg total) by mouth every 12 (twelve) hours as needed. Reported on 10/04/2015, Disp: 60 tablet, Rfl: 1 .  lisinopril (PRINIVIL,ZESTRIL) 20 MG tablet, Take 1 tablet (20 mg total) by mouth daily., Disp: 30 tablet, Rfl: 2 .  loperamide (IMODIUM A-D) 2 MG tablet, Take 2 mg by mouth 4 (four) times daily as needed for diarrhea or loose stools., Disp: , Rfl:  .  metoprolol (LOPRESSOR) 100 MG tablet, Take 1 tablet (100 mg total) by mouth 2 (two) times daily., Disp: 60 tablet, Rfl: 1   Review of Systems  Constitutional: Positive for fatigue. Negative for fever, chills, diaphoresis, appetite change  and unexpected weight change.  HENT: Positive for dental problem and sneezing. Negative for congestion, drooling, ear pain, facial swelling, hearing loss, mouth sores, sore throat, trouble swallowing and voice change.   Eyes: Positive for redness. Negative for pain, discharge, itching and visual disturbance.  Respiratory: Positive for cough, chest tightness, shortness of breath and wheezing. Negative for choking.   Cardiovascular: Positive for palpitations. Negative for chest pain and leg swelling.  Gastrointestinal: Positive for diarrhea. Negative for vomiting, abdominal pain, constipation and blood in stool.  Endocrine: Negative for cold intolerance, heat intolerance and polydipsia.  Genitourinary: Negative for dysuria, hematuria and decreased urine volume.  Musculoskeletal: Positive for back pain and arthralgias. Negative for gait problem.  Skin: Negative for rash.  Allergic/Immunologic: Negative for environmental allergies.  Neurological: Positive for headaches. Negative for seizures, syncope and light-headedness.  Hematological: Negative for adenopathy.  Psychiatric/Behavioral: Positive for dysphoric mood and agitation. Negative for suicidal ideas. The patient is nervous/anxious.     Per HPI unless specifically indicated above     Objective:    BP 190/120 mmHg  Pulse 64  Temp(Src) 99 F (37.2 C)  Ht  (1.651 m)  Wt 151 lb 12 oz (68.833 kg)  BMI 25.25 kg/m2  SpO2 98%  Wt Readings from Last 3 Encounters:  12/07/15 151 lb 12 oz (68.833 kg)  11/23/15 142 lb 6.4 oz (64.592 kg)  11/16/15  144 lb 6.4 oz (65.499 kg)    Physical Exam  Constitutional: She is oriented to person, place, and time. She appears well-developed and well-nourished.  HENT:  Head: Normocephalic and atraumatic.  Neck: Neck supple.  Cardiovascular: Normal rate and regular rhythm.   Pulmonary/Chest: Effort normal and breath sounds normal.  Abdominal: Soft. Bowel sounds are normal. She exhibits no mass.  There is no hepatosplenomegaly. There is no tenderness.  Musculoskeletal: She exhibits no edema.  Lymphadenopathy:    She has no cervical adenopathy.  Neurological: She is alert and oriented to person, place, and time.  Skin: Skin is warm and dry.  Psychiatric: She has a normal mood and affect. Her behavior is normal.  Vitals reviewed.       Assessment & Plan:   Encounter Diagnoses  Name Primary?  . Essential hypertension, benign Yes  . Ectatic aorta (HCC)      -Increase lisinopril to 40mg  daily (two 20mg  tablets) -Continue metoprolol 100mg  tablets twice daily -counseled pt to drink plenty fluids, especially water -f/u 64month to recheck bp

## 2015-12-07 NOTE — Progress Notes (Signed)
Quick Note:  [facility] [place] [facility], said the stool that pt turned in for the GI pathogen was formed stool and could not be used. ______

## 2015-12-07 NOTE — Patient Instructions (Addendum)
Increase lisinopril to 40mg  daily (two 20mg  tablets) Continue metoprolol 100mg  tablets twice daily    HOME CARE INSTRUCTIONS   Drink enough fluid to keep your urine clear or pale yellow.   Drink water or fluid slowly by taking small sips. You can also try sucking on ice cubes.

## 2015-12-08 ENCOUNTER — Telehealth: Payer: Self-pay

## 2015-12-08 ENCOUNTER — Other Ambulatory Visit: Payer: Self-pay

## 2015-12-08 DIAGNOSIS — K921 Melena: Secondary | ICD-10-CM

## 2015-12-08 DIAGNOSIS — K529 Noninfective gastroenteritis and colitis, unspecified: Secondary | ICD-10-CM

## 2015-12-08 MED ORDER — PEG 3350-KCL-NA BICARB-NACL 420 G PO SOLR: 4000.0000 mL | ORAL | Status: DC

## 2015-12-08 NOTE — Progress Notes (Signed)
Quick Note:  . ______ 

## 2015-12-08 NOTE — Progress Notes (Signed)
Quick Note:  PT is aware of the info. OK to scheduled the procedures. I have updated the med list separately and [facility] [place]. [facility] schedule. ______

## 2015-12-08 NOTE — Progress Notes (Signed)
Quick Note:  Go ahead and schedule for TCS+/-EGD with [facility] in OR (h/o polysubstance abuse in the past). Dx: chronic diarrhea, melena, brbpr, heme + stool.  As far as viral hepatitis. She has chronic HCV, genotype 1a. Her Hep B labs indicate occult HBV or prior exposure without detectable Hep B surf Ab (immunity). I need to discuss with the hepatitis specialist in [place].   It is noted that GI pathogen panel was cancelled due to formed stool submitted. ______

## 2015-12-08 NOTE — Telephone Encounter (Signed)
Updated med list for the procedures.

## 2015-12-08 NOTE — Telephone Encounter (Signed)
Ok to schedule as per order on result note. Medication list noted.

## 2015-12-30 ENCOUNTER — Other Ambulatory Visit: Payer: Self-pay

## 2015-12-30 ENCOUNTER — Telehealth: Payer: Self-pay | Admitting: Gastroenterology

## 2015-12-30 MED ORDER — PEG 3350-KCL-NA BICARB-NACL 420 G PO SOLR: 4000.0000 mL | ORAL | Status: DC

## 2015-12-30 NOTE — Telephone Encounter (Signed)
PATIENT STATED SHE HAS NOT RECEIVED ANY INSTRUCTIONS FOR HER PROCEDURES THAT ARE COMING UP.  PLEASE ADVISE.

## 2015-12-30 NOTE — Telephone Encounter (Signed)
I sent it back into the pharmacy

## 2016-01-03 ENCOUNTER — Emergency Department (HOSPITAL_COMMUNITY)
Admission: EM | Admit: 2016-01-03 | Discharge: 2016-01-04 | Disposition: A | Discharge status: Home / Self Care | Payer: Self-pay | Attending: Emergency Medicine | Admitting: Emergency Medicine

## 2016-01-03 ENCOUNTER — Encounter: Payer: Self-pay | Admitting: Physician Assistant

## 2016-01-03 ENCOUNTER — Emergency Department (HOSPITAL_COMMUNITY): Payer: Self-pay

## 2016-01-03 ENCOUNTER — Encounter (HOSPITAL_COMMUNITY): Payer: Self-pay | Admitting: Emergency Medicine

## 2016-01-03 ENCOUNTER — Ambulatory Visit: Payer: Self-pay | Admitting: Physician Assistant

## 2016-01-03 VITALS — BP 210/136 | HR 57 | Temp 98.1°F | Ht 65.0 in | Wt 150.0 lb

## 2016-01-03 DIAGNOSIS — F17210 Nicotine dependence, cigarettes, uncomplicated: Secondary | ICD-10-CM | POA: Insufficient documentation

## 2016-01-03 DIAGNOSIS — I10 Essential (primary) hypertension: Secondary | ICD-10-CM | POA: Insufficient documentation

## 2016-01-03 DIAGNOSIS — F419 Anxiety disorder, unspecified: Secondary | ICD-10-CM

## 2016-01-03 DIAGNOSIS — F17219 Nicotine dependence, cigarettes, with unspecified nicotine-induced disorders: Secondary | ICD-10-CM

## 2016-01-03 DIAGNOSIS — R519 Headache, unspecified: Secondary | ICD-10-CM

## 2016-01-03 DIAGNOSIS — R079 Chest pain, unspecified: Secondary | ICD-10-CM

## 2016-01-03 DIAGNOSIS — R0602 Shortness of breath: Secondary | ICD-10-CM | POA: Insufficient documentation

## 2016-01-03 DIAGNOSIS — K2960 Other gastritis without bleeding: Secondary | ICD-10-CM

## 2016-01-03 DIAGNOSIS — R55 Syncope and collapse: Secondary | ICD-10-CM

## 2016-01-03 DIAGNOSIS — R0789 Other chest pain: Secondary | ICD-10-CM | POA: Insufficient documentation

## 2016-01-03 DIAGNOSIS — K2970 Gastritis, unspecified, without bleeding: Secondary | ICD-10-CM | POA: Insufficient documentation

## 2016-01-03 DIAGNOSIS — R51 Headache: Secondary | ICD-10-CM

## 2016-01-03 DIAGNOSIS — J449 Chronic obstructive pulmonary disease, unspecified: Secondary | ICD-10-CM | POA: Insufficient documentation

## 2016-01-03 DIAGNOSIS — T39395A Adverse effect of other nonsteroidal anti-inflammatory drugs [NSAID], initial encounter: Secondary | ICD-10-CM

## 2016-01-03 DIAGNOSIS — N189 Chronic kidney disease, unspecified: Secondary | ICD-10-CM

## 2016-01-03 DIAGNOSIS — Z79899 Other long term (current) drug therapy: Secondary | ICD-10-CM | POA: Insufficient documentation

## 2016-01-03 LAB — HEPATIC FUNCTION PANEL
ALBUMIN: 3.8 g/dL (ref 3.5–5.0)
ALT: 14 U/L (ref 14–54)
AST: 24 U/L (ref 15–41)
Alkaline Phosphatase: 67 U/L (ref 38–126)
Bilirubin, Direct: 0.1 mg/dL (ref 0.1–0.5)
Indirect Bilirubin: 0.3 mg/dL (ref 0.3–0.9)
TOTAL PROTEIN: 7 g/dL (ref 6.5–8.1)
Total Bilirubin: 0.4 mg/dL (ref 0.3–1.2)

## 2016-01-03 LAB — CBC
HEMATOCRIT: 39.4 % (ref 36.0–46.0)
Hemoglobin: 13.4 g/dL (ref 12.0–15.0)
MCH: 30.7 pg (ref 26.0–34.0)
MCHC: 34 g/dL (ref 30.0–36.0)
MCV: 90.4 fL (ref 78.0–100.0)
Platelets: 197 10*3/uL (ref 150–400)
RBC: 4.36 MIL/uL (ref 3.87–5.11)
RDW: 12.2 % (ref 11.5–15.5)
WBC: 8.8 10*3/uL (ref 4.0–10.5)

## 2016-01-03 LAB — BASIC METABOLIC PANEL
ANION GAP: 6 (ref 5–15)
BUN: 20 mg/dL (ref 6–20)
CALCIUM: 8.8 mg/dL — AB (ref 8.9–10.3)
CO2: 28 mmol/L (ref 22–32)
Chloride: 103 mmol/L (ref 101–111)
Creatinine, Ser: 1.13 mg/dL — ABNORMAL HIGH (ref 0.44–1.00)
GFR, EST NON AFRICAN AMERICAN: 52 mL/min — AB (ref >60)
Glucose, Bld: 97 mg/dL (ref 65–99)
POTASSIUM: 3.8 mmol/L (ref 3.5–5.1)
Sodium: 137 mmol/L (ref 135–145)

## 2016-01-03 LAB — I-STAT TROPONIN, ED: TROPONIN I, POC: 0 ng/mL (ref 0.00–0.08)

## 2016-01-03 LAB — LIPASE, BLOOD: LIPASE: 31 U/L (ref 11–51)

## 2016-01-03 LAB — TROPONIN I: Troponin I: <0.03 ng/mL (ref <0.03)

## 2016-01-03 MED ORDER — IBUPROFEN 800 MG PO TABS: 800.0000 mg | ORAL_TABLET | Freq: Two times a day (BID) | ORAL | Status: DC | PRN

## 2016-01-03 MED ORDER — CLONIDINE HCL 0.1 MG PO TABS: 0.1000 mg | ORAL_TABLET | Freq: Two times a day (BID) | ORAL | Status: DC | PRN

## 2016-01-03 MED ORDER — CLONIDINE HCL 0.2 MG PO TABS
0.2000 mg | ORAL_TABLET | Freq: Once | ORAL | Status: AC
  Administered 2016-01-03: 0.2 mg via ORAL
  Filled 2016-01-03: qty 1

## 2016-01-03 MED ORDER — LANSOPRAZOLE 30 MG PO CPDR: 30.0000 mg | DELAYED_RELEASE_CAPSULE | Freq: Every day | ORAL | Status: DC

## 2016-01-03 MED ORDER — HYDRALAZINE HCL 25 MG PO TABS
ORAL_TABLET | ORAL | Status: AC
  Filled 2016-01-03: qty 1

## 2016-01-03 MED ORDER — GI COCKTAIL ~~LOC~~
30.0000 mL | Freq: Once | ORAL | Status: AC
  Administered 2016-01-03: 30 mL via ORAL
  Filled 2016-01-03: qty 30

## 2016-01-03 MED ORDER — HYDRALAZINE HCL 25 MG PO TABS
25.0000 mg | ORAL_TABLET | Freq: Once | ORAL | Status: AC
  Administered 2016-01-03: 25 mg via ORAL
  Filled 2016-01-03: qty 1

## 2016-01-03 MED ORDER — LISINOPRIL 20 MG PO TABS: 40.0000 mg | ORAL_TABLET | Freq: Every day | ORAL | Status: AC

## 2016-01-03 MED ORDER — HYDROCHLOROTHIAZIDE 25 MG PO TABS: 25.0000 mg | ORAL_TABLET | Freq: Every day | ORAL | Status: DC

## 2016-01-03 NOTE — Progress Notes (Signed)
BP 210/136 mmHg  Pulse 57  Temp(Src) 98.1 F (36.7 C)  Ht  (1.651 m)  Wt 150 lb (68.04 kg)  BMI 24.96 kg/m2  SpO2 99%   Subjective:    Patient ID: Jody Alvarez, female    DOB: 1956/03/15, 60 y.o.   MRN: [id number]  HPI: Jody Alvarez is a 60 y.o. female presenting on 01/03/2016 for Hypertension   HPI   Pt is now stating she is having episodes of CP and syncope.  She says the CP is not from her wheezing. States CP lasts for several minutes and then goes away.  States this comes with exertion and while at rest.  She also she states she has had 3 episodes of syncope which lasted about 30 seconds.   She is also c/o Headaches.  These are new things that she has not reported at previous appointments.    She was new patient 09/14/15.  Her bp at that appointment was 144/104- she was on lisinopril -hctz 20/12.5.  At her 10/04/15 appoiontment her bp was up to 180/104 because she stopped taking her bp med.   On  11/01/15 bp was 186/122.  The BP keeps creeping upward despite adding more medications.  Pt is scheduled for colonoscopy next week  Relevant past medical, surgical, family and social history reviewed and updated as indicated. Interim medical history since our last visit reviewed. Allergies and medications reviewed and updated.   Current outpatient prescriptions:  .  albuterol (PROVENTIL HFA;VENTOLIN HFA) 108 (90 Base) MCG/ACT inhaler, Inhale 2 puffs into the lungs every 6 (six) hours as needed for wheezing or shortness of breath., Disp: 3 Inhaler, Rfl: 0 .  diphenhydramine-acetaminophen (TYLENOL PM) 25-500 MG TABS tablet, Take 1 tablet by mouth at bedtime as needed (sleep)., Disp: , Rfl:  .  ibuprofen (ADVIL,MOTRIN) 800 MG tablet, Take 1 tablet (800 mg total) by mouth every 12 (twelve) hours as needed. Reported on 10/04/2015, Disp: 60 tablet, Rfl: 1 .  lisinopril (PRINIVIL,ZESTRIL) 20 MG tablet, Take 2 tablets (40 mg total) by mouth daily., Disp: 60 tablet, Rfl: 1 .  loperamide  (IMODIUM) 2 MG capsule, Take 2 mg by mouth as needed for diarrhea or loose stools., Disp: , Rfl:  .  metoprolol (LOPRESSOR) 100 MG tablet, Take 1 tablet (100 mg total) by mouth 2 (two) times daily., Disp: 60 tablet, Rfl: 1 .  amitriptyline (ELAVIL) 25 MG tablet, Take 25 mg by mouth at bedtime. Reported on 01/03/2016, Disp: , Rfl:  .  polyethylene glycol-electrolytes (TRILYTE) 420 g solution, Take 4,000 mLs by mouth as directed. (Patient not taking: Reported on 01/03/2016), Disp: 4000 mL, Rfl: 0   Review of Systems  Constitutional: Positive for diaphoresis and fatigue. Negative for fever, chills, appetite change and unexpected weight change.  HENT: Positive for dental problem and sneezing. Negative for congestion, drooling, ear pain, facial swelling, hearing loss, mouth sores, sore throat, trouble swallowing and voice change.   Eyes: Positive for redness and visual disturbance. Negative for pain, discharge and itching.  Respiratory: Positive for cough, shortness of breath and wheezing. Negative for choking.   Cardiovascular: Positive for chest pain and palpitations. Negative for leg swelling.  Gastrointestinal: Positive for abdominal pain, diarrhea and blood in stool. Negative for vomiting and constipation.  Endocrine: Positive for heat intolerance. Negative for cold intolerance and polydipsia.  Genitourinary: Negative for dysuria, hematuria and decreased urine volume.  Musculoskeletal: Positive for back pain and gait problem. Negative for arthralgias.  Skin: Negative for rash.  Allergic/Immunologic: Negative for environmental allergies.  Neurological: Positive for syncope, light-headedness and headaches. Negative for seizures.  Hematological: Negative for adenopathy.  Psychiatric/Behavioral: Positive for dysphoric mood and agitation. Negative for suicidal ideas. The patient is nervous/anxious.     Per HPI unless specifically indicated above     Objective:    BP 210/136 mmHg  Pulse 57   Temp(Src) 98.1 F (36.7 C)  Ht 5\' 5"  (1.651 m)  Wt 150 lb (68.04 kg)  BMI 24.96 kg/m2  SpO2 99%  Wt Readings from Last 3 Encounters:  01/03/16 150 lb (68.04 kg)  12/07/15 151 lb 12 oz (68.833 kg)  11/23/15 142 lb 6.4 oz (64.592 kg)    Physical Exam  Constitutional: She is oriented to person, place, and time. She appears well-developed and well-nourished.  HENT:  Head: Normocephalic and atraumatic.  Neck: Neck supple.  Cardiovascular: Normal rate and regular rhythm.   Pulmonary/Chest: Effort normal and breath sounds normal.  Abdominal: Soft. Bowel sounds are normal. She exhibits no mass. There is no hepatosplenomegaly. There is no tenderness.  Musculoskeletal: She exhibits no edema.  Lymphadenopathy:    She has no cervical adenopathy.  Neurological: She is alert and oriented to person, place, and time.  Skin: Skin is warm and dry.  Psychiatric: She has a normal mood and affect. Her behavior is normal.  Vitals reviewed.  EKG - sinus bradycardia. No st-t changes. No previous for comparison    Assessment & Plan:    Encounter Diagnoses  Name Primary?  . Essential hypertension, benign Yes  . Chest pain, unspecified chest pain type   . Headache, unspecified headache type   . Syncope, unspecified syncope type   . CKD (chronic kidney disease), unspecified stage   . Cigarette nicotine dependence with nicotine-induced disorder   . Anxiety disorder, unspecified anxiety disorder type      -Discussed with pt to go to ER but she doesn't want to.  Pt says she doesn't want to because she went in the past and says she doesn't want repeat of that.  But then says she will if I think she ought to -Refer to cardiology for cp and uncontrolled htn -Add hctz 25mg  -Pt says she turned in her [employer] discount application- she says she got some percent but doesn't know what it is. -pt continues with GI for evaluation and treatment -F/u next week.

## 2016-01-03 NOTE — ED Notes (Signed)
Patient complaining of chest pain starting approximately 1 hour prior to arrival. Also complaining of headache.

## 2016-01-03 NOTE — Discharge Instructions (Signed)
Nonspecific Chest Pain  Chest pain can be caused by many different conditions. There is always a chance that your pain could be related to something serious, such as a heart attack or a blood clot in your lungs. Chest pain can also be caused by conditions that are not life-threatening. If you have chest pain, it is very important to follow up with your health care provider. CAUSES  Chest pain can be caused by:  Heartburn.  Pneumonia or bronchitis.  Anxiety or stress.  Inflammation around your heart (pericarditis) or lung (pleuritis or pleurisy).  A blood clot in your lung.  A collapsed lung (pneumothorax). It can develop suddenly on its own (spontaneous pneumothorax) or from trauma to the chest.  Shingles infection (varicella-zoster virus).  Heart attack.  Damage to the bones, muscles, and cartilage that make up your chest wall. This can include:  Bruised bones due to injury.  Strained muscles or cartilage due to frequent or repeated coughing or overwork.  Fracture to one or more ribs.  Sore cartilage due to inflammation (costochondritis). RISK FACTORS  Risk factors for chest pain may include:  Activities that increase your risk for trauma or injury to your chest.  Respiratory infections or conditions that cause frequent coughing.  Medical conditions or overeating that can cause heartburn.  Heart disease or family history of heart disease.  Conditions or health behaviors that increase your risk of developing a blood clot.  Having had chicken pox (varicella zoster). SIGNS AND SYMPTOMS Chest pain can feel like:  Burning or tingling on the surface of your chest or deep in your chest.  Crushing, pressure, aching, or squeezing pain.  Dull or sharp pain that is worse when you move, cough, or take a deep breath.  Pain that is also felt in your back, neck, shoulder, or arm, or pain that spreads to any of these areas. Your chest pain may come and go, or it may stay  constant. DIAGNOSIS Lab tests or other studies may be needed to find the cause of your pain. Your health care provider may have you take a test called an ambulatory ECG (electrocardiogram). An ECG records your heartbeat patterns at the time the test is performed. You may also have other tests, such as:  Transthoracic echocardiogram (TTE). During echocardiography, sound waves are used to create a picture of all of the heart structures and to look at how blood flows through your heart.  Transesophageal echocardiogram (TEE).This is a more advanced imaging test that obtains images from inside your body. It allows your health care provider to see your heart in finer detail.  Cardiac monitoring. This allows your health care provider to monitor your heart rate and rhythm in real time.  Holter monitor. This is a portable device that records your heartbeat and can help to diagnose abnormal heartbeats. It allows your health care provider to track your heart activity for several days, if needed.  Stress tests. These can be done through exercise or by taking medicine that makes your heart beat more quickly.  Blood tests.  Imaging tests. TREATMENT  Your treatment depends on what is causing your chest pain. Treatment may include:  Medicines. These may include:  Acid blockers for heartburn.  Anti-inflammatory medicine.  Pain medicine for inflammatory conditions.  Antibiotic medicine, if an infection is present.  Medicines to dissolve blood clots.  Medicines to treat coronary artery disease.  Supportive care for conditions that do not require medicines. This may include:  Resting.  Applying heat  heartburn.    Anti-inflammatory medicine.    Pain medicine for inflammatory conditions.    Antibiotic medicine, if an infection is present.    Medicines to dissolve blood clots.    Medicines to treat coronary artery disease.   Supportive care for conditions that do not require medicines. This may include:    Resting.    Applying heat or cold packs to injured areas.    Limiting activities until pain decreases.  HOME CARE INSTRUCTIONS   If you were prescribed an antibiotic medicine, finish it all even if you start to feel better.   Avoid any activities that bring on chest pain.   Do not use any tobacco products, including  cigarettes, chewing tobacco, or electronic cigarettes. If you need help quitting, ask your health care provider.   Do not drink alcohol.   Take medicines only as directed by your health care provider.   Keep all follow-up visits as directed by your health care provider. This is important. This includes any further testing if your chest pain does not go away.   If heartburn is the cause for your chest pain, you may be told to keep your head raised (elevated) while sleeping. This reduces the chance that acid will go from your stomach into your esophagus.   Make lifestyle changes as directed by your health care provider. These may include:    Getting regular exercise. Ask your health care provider to suggest some activities that are safe for you.    Eating a heart-healthy diet. A registered dietitian can help you to learn healthy eating options.    Maintaining a healthy weight.    Managing diabetes, if necessary.    Reducing stress.  SEEK MEDICAL CARE IF:   Your chest pain does not go away after treatment.   You have a rash with blisters on your chest.   You have a fever.  SEEK IMMEDIATE MEDICAL CARE IF:    Your chest pain is worse.   You have an increasing cough, or you cough up blood.   You have severe abdominal pain.   You have severe weakness.   You faint.   You have chills.   You have sudden, unexplained chest discomfort.   You have sudden, unexplained discomfort in your arms, back, neck, or jaw.   You have shortness of breath at any time.   You suddenly start to sweat, or your skin gets clammy.   You feel nauseous or you vomit.   You suddenly feel light-headed or dizzy.   Your heart begins to beat quickly, or it feels like it is skipping beats.  These symptoms may represent a serious problem that is an emergency. Do not wait to see if the symptoms will go away. Get medical help right away. Call your local emergency services (911 in the [place] Do not drive yourself to the hospital.     This  information is not intended to replace advice given to you by your health care provider. Make sure you discuss any questions you have with your health care provider.     Document Released: 03/15/2005 Document Revised: 06/26/2014 Document Reviewed: 01/09/2014  Elsevier Interactive Patient Education 2016 Elsevier Inc.  Hypertension  Hypertension, commonly called high blood pressure, is when the force of blood pumping through your arteries is too strong. Your arteries are the blood vessels that carry blood from your heart throughout your body. A blood pressure reading consists of a higher number over a lower number, such   as 110/72. The higher number (systolic) is the pressure inside your arteries when your heart pumps. The lower number (diastolic) is the pressure inside your arteries when your heart relaxes. Ideally you want your blood pressure below 120/80.  Hypertension forces your heart to work harder to pump blood. Your arteries may become narrow or stiff. Having untreated or uncontrolled hypertension can cause heart attack, stroke, kidney disease, and other problems.  RISK FACTORS  Some risk factors for high blood pressure are controllable. Others are not.   Risk factors you cannot control include:    Race. You may be at higher risk if you are African American.   Age. Risk increases with age.   Gender. Men are at higher risk than women before age 45 years. After age 65, women are at higher risk than men.  Risk factors you can control include:   Not getting enough exercise or physical activity.   Being overweight.   Getting too much fat, sugar, calories, or salt in your diet.   Drinking too much alcohol.  SIGNS AND SYMPTOMS  Hypertension does not usually cause signs or symptoms. Extremely high blood pressure (hypertensive crisis) may cause headache, anxiety, shortness of breath, and nosebleed.  DIAGNOSIS  To check if you have hypertension, your health care provider will measure your blood pressure while you  are seated, with your arm held at the level of your heart. It should be measured at least twice using the same arm. Certain conditions can cause a difference in blood pressure between your right and left arms. A blood pressure reading that is higher than normal on one occasion does not mean that you need treatment. If it is not clear whether you have high blood pressure, you may be asked to return on a different day to have your blood pressure checked again. Or, you may be asked to monitor your blood pressure at home for 1 or more weeks.  TREATMENT  Treating high blood pressure includes making lifestyle changes and possibly taking medicine. Living a healthy lifestyle can help lower high blood pressure. You may need to change some of your habits.  Lifestyle changes may include:   Following the DASH diet. This diet is high in fruits, vegetables, and whole grains. It is low in salt, red meat, and added sugars.   Keep your sodium intake below 2,300 mg per day.   Getting at least 30-45 minutes of aerobic exercise at least 4 times per week.   Losing weight if necessary.   Not smoking.   Limiting alcoholic beverages.   Learning ways to reduce stress.  Your health care provider may prescribe medicine if lifestyle changes are not enough to get your blood pressure under control, and if one of the following is true:   You are 18-59 years of age and your systolic blood pressure is above 140.   You are 60 years of age or older, and your systolic blood pressure is above 150.   Your diastolic blood pressure is above 90.   You have diabetes, and your systolic blood pressure is over 140 or your diastolic blood pressure is over 90.   You have kidney disease and your blood pressure is above 140/90.   You have heart disease and your blood pressure is above 140/90.  Your personal target blood pressure may vary depending on your medical conditions, your age, and other factors.  HOME CARE INSTRUCTIONS   Have your blood  pressure rechecked as directed by your health care   Understand these instructions.  Will watch your condition.  Will get help right away if you are not doing well or get worse.   This information is not intended to replace advice given to you by your health care provider. Make sure you discuss any questions you have with your health care provider.   Document Released: 06/05/2005 Document Revised: 10/20/2014 Document Reviewed: 03/28/2013 Elsevier Interactive Patient Education 2016 Elsevier Inc.  Gastritis, Adult Gastritis is soreness and puffiness (inflammation) of the lining of the stomach. If you do not get help, gastritis can cause bleeding and sores (ulcers) in the stomach. HOME CARE   Only take medicine as told by your doctor.  If you were given antibiotic medicines, take them as told. Finish the medicines even if you start to feel better.  Drink enough fluids to keep your pee (urine) clear or pale yellow.  Avoid foods and drinks that make your problems worse. Foods you may want to avoid include:  Caffeine or alcohol.  Chocolate.  Mint.  Garlic and onions.  Spicy  foods.  Citrus fruits, including oranges, lemons, or limes.  Food containing tomatoes, including sauce, chili, salsa, and pizza.  Fried and fatty foods.  Eat small meals throughout the day instead of large meals. GET HELP RIGHT AWAY IF:   You have black or dark red poop (stools).  You throw up (vomit) blood. It may look like coffee grounds.  You cannot keep fluids down.  Your belly (abdominal) pain gets worse.  You have a fever.  You do not feel better after 1 week.  You have any other questions or concerns. MAKE SURE YOU:   Understand these instructions.  Will watch your condition.  Will get help right away if you are not doing well or get worse.   This information is not intended to replace advice given to you by your health care provider. Make sure you discuss any questions you have with your health care provider.   Document Released: 11/22/2007 Document Revised: 08/28/2011 Document Reviewed: 07/19/2011 Elsevier Interactive Patient Education [employer]2016 Elsevier Inc.

## 2016-01-03 NOTE — ED Provider Notes (Signed)
CSN: [id number]     Arrival date & time 01/03/16  1840 History   First MD Initiated Contact with Patient 01/03/16 1919     Chief Complaint  Patient presents with  . Chest Pain  . Headache     (Consider location/radiation/quality/duration/timing/severity/associated sxs/prior Treatment) HPI Patient sent from her physician's office for elevated blood pressure. Patient states she has a history of difficult to control blood pressure. She is compliant with her medications. She has chronic headaches and this is unchanged. She takes 1-2 800 mg ibuprofen tablets several times daily. She also complains of ongoing epigastric pain. Denies melena or gross blood in the stool. She states that while driving she had an episode of sharp left-sided chest pain. It lasted several seconds. No radiation of the pain. No associated nausea, vomiting. She currently has no chest pain. She has a history of COPD and chronic cough. This has worsened in the last few months. Denies any new lower extremity swelling or pain. No focal weakness or numbness. No visual changes. Past Medical History  Diagnosis Date  . Hypertension   . Anxiety 2008    lost 4 close family members in 4 yrs, "doesn't like to leave house"  . Anxiety     plans to return to [facility] for counseling, has been in past  . Chronic hepatitis C (HCC)   . COPD (chronic obstructive pulmonary disease) [facility])    Past Surgical History  Procedure Laterality Date  . Rectocele repair    . Vaginal hysterectomy      partial   Family History  Problem Relation Age of Onset  . Diabetes Mother   . Cancer Mother     uterine  . Cancer Father     unknown primary  . Cancer Sister     pancreatic  . Cancer Maternal Grandfather     lung  . Cancer Sister     cervical  . Cancer Sister     cervical  . Colon cancer Neg Hx   . Liver disease Neg Hx    Social History  Substance Use Topics  . Smoking status: Current Every Day Smoker -- 1.00 packs/day for 50 years     Types: Cigarettes  . Smokeless tobacco: Never Used  . Alcohol Use: No   OB History    No data available     Review of Systems  Constitutional: Negative for fever and chills.  Respiratory: Positive for cough, shortness of breath and wheezing.   Cardiovascular: Positive for chest pain. Negative for palpitations and leg swelling.  Gastrointestinal: Negative for nausea, vomiting, abdominal pain, diarrhea and constipation.  Genitourinary: Negative for dysuria, frequency, hematuria and flank pain.  Musculoskeletal: Negative for myalgias, back pain, neck pain and neck stiffness.  Skin: Negative for rash and wound.  Neurological: Positive for headaches. Negative for dizziness, syncope, weakness, light-headedness and numbness.  All other systems reviewed and are negative.     Allergies  Quetiapine; Trazodone and nefazodone; Flexeril; and Phenergan  Home Medications   Prior to Admission medications   Medication Sig Start Date End Date Taking? Authorizing Provider  albuterol (PROVENTIL HFA;VENTOLIN HFA) 108 (90 Base) MCG/ACT inhaler Inhale 2 puffs into the lungs every 6 (six) hours as needed for wheezing or shortness of breath. 11/02/15  Yes Jacquelin Hawking, PA-C  amitriptyline (ELAVIL) 25 MG tablet Take 25 mg by mouth at bedtime. Reported on 01/03/2016   Yes Historical Provider, MD  diphenhydramine-acetaminophen (TYLENOL PM) 25-500 MG TABS tablet Take 1 tablet by  mouth at bedtime as needed (sleep).   Yes Historical Provider, MD  lisinopril (PRINIVIL,ZESTRIL) 20 MG tablet Take 2 tablets (40 mg total) by mouth daily. 01/03/16  Yes Jacquelin Hawking, PA-C  loperamide (IMODIUM) 2 MG capsule Take 2 mg by mouth as needed for diarrhea or loose stools.   Yes Historical Provider, MD  metoprolol (LOPRESSOR) 100 MG tablet Take 1 tablet (100 mg total) by mouth 2 (two) times daily. 11/16/15  Yes Jacquelin Hawking, PA-C  Tetrahydroz-Glyc-Hyprom-PEG (VISINE MAXIMUM REDNESS RELIEF) 0.05-0.2-0.36-1 % SOLN Apply  1 drop to eye daily as needed (for redness relief).   Yes Historical Provider, MD  cloNIDine (CATAPRES) 0.1 MG tablet Take 1 tablet (0.1 mg total) by mouth 2 (two) times daily as needed (systolic blood pressure >190). 01/03/16   Loren Racer, MD  hydrochlorothiazide (HYDRODIURIL) 25 MG tablet Take 1 tablet (25 mg total) by mouth daily. 01/03/16   Jacquelin Hawking, PA-C  lansoprazole (PREVACID) 30 MG capsule Take 1 capsule (30 mg total) by mouth daily at 12 noon. 01/03/16   Loren Racer, MD  polyethylene glycol-electrolytes (TRILYTE) 420 g solution Take 4,000 mLs by mouth as directed. Patient not taking: Reported on 01/03/2016 12/30/15   [place]ali, MD   BP 199/108 mmHg  Pulse 60  Temp(Src) 98.1 F (36.7 C) (Oral)  Resp 20  Ht 5\' 6"  (1.676 m)  Wt 150 lb (68.04 kg)  BMI 24.22 kg/m2  SpO2 97% Physical Exam  Constitutional: She is oriented to person, place, and time. She appears well-developed and well-nourished. No distress.  HENT:  Head: Normocephalic and atraumatic.  Mouth/Throat: Oropharynx is clear and moist. No oropharyngeal exudate.  Eyes: EOM are normal. Pupils are equal, round, and reactive to light.  Neck: Normal range of motion. Neck supple.  Cardiovascular: Normal rate and regular rhythm.  Exam reveals no gallop and no friction rub.   No murmur heard. Pulmonary/Chest: Effort normal. No respiratory distress. She has no wheezes. She has rales.  Few crackles bilateral bases  Abdominal: Soft. Bowel sounds are normal. She exhibits no distension and no mass. There is tenderness (Epigastric tenderness with palpation). There is no rebound and no guarding.  Musculoskeletal: Normal range of motion. She exhibits no edema or tenderness.  No lower extremity swelling or asymmetry.  Neurological: She is alert and oriented to person, place, and time.  5/5 motor in all extremities. Sensation is fully intact.  Skin: Skin is warm and dry. No rash noted. No erythema.  Psychiatric: She has a  normal mood and affect. Her behavior is normal.  Nursing note and vitals reviewed.   ED Course  Procedures (including critical care time) Labs Review Labs Reviewed  BASIC METABOLIC PANEL - Abnormal; Notable for the following:    Creatinine, Ser 1.13 (*)    Calcium 8.8 (*)    GFR calc non Af Amer 52 (*)    All other components within normal limits  CBC  TROPONIN I  HEPATIC FUNCTION PANEL  LIPASE, BLOOD  I-STAT TROPOININ, ED    Imaging Review Dg Chest 2 View  01/03/2016  CLINICAL DATA:  Chest pain, shortness of breath, hypertension EXAM: CHEST  2 VIEW COMPARISON:  10/12/2015 FINDINGS: Lungs are clear.  No pleural effusion or pneumothorax. The heart is normal in size. Mild degenerative changes of the visualized thoracolumbar spine. IMPRESSION: No evidence of acute cardiopulmonary disease. Electronically Signed   By: Charline Bills M.D.   On: 01/03/2016 19:18   I have personally reviewed and evaluated these images  and lab results as part of my medical decision-making.   EKG Interpretation   Date/Time:  Monday January 03 2016 22:44:51 EDT Ventricular Rate:  60 PR Interval:    QRS Duration: 106 QT Interval:  435 QTC Calculation: 435 R Axis:   32 Text Interpretation:  Sinus rhythm Anteroseptal infarct, old Minimal ST  depression, lateral leads Confirmed by Ranae PalmsYELVERTON  MD, Ennio Houp ([id number]) on  01/03/2016 11:31:43 PM      MDM   Final diagnoses:  Atypical chest pain  Uncontrolled hypertension  NSAID induced gastritis   Troponin 2 is negative. EKG with no ischemic findings. Patient admits to taking [phone] mg of ibuprofen 3 times daily. This likely the cause for her epigastric pain. Improved after GI cocktail. We'll start on PPI. Advised avoiding all NSAIDs. Also [facility]. Patient is advised to follow-up with her primary physician regarding blood pressure control. Return precautions given.    Loren Raceravid Niomie Englert, MD 01/03/16 [phone]

## 2016-01-04 ENCOUNTER — Telehealth: Payer: Self-pay

## 2016-01-04 ENCOUNTER — Encounter (HOSPITAL_COMMUNITY)
Admission: RE | Admit: 2016-01-04 | Discharge: 2016-01-04 | Disposition: A | Discharge status: Home / Self Care | Payer: Self-pay | Source: Ambulatory Visit | Attending: Gastroenterology | Admitting: Gastroenterology

## 2016-01-04 NOTE — Telephone Encounter (Signed)
LMOM to call back

## 2016-01-04 NOTE — Telephone Encounter (Signed)
REVIEWED. PT SCHEDULED FOR TCS JUL 25. BP IS ELEVATED DBP 136. STARTED ON LISINOPRIL AND HCTZ.  PLEASE CALL PT. I REVIEWED HER RECORDS. IF SHE IS STARTED ON BP MEDS SHE SHOULD BE ABLE TO COMPLETE HER TCS, BUT IF SHE IS HAVING CHEST PAIN SHE SHOULD WAIT UNTIL CLEARED BY CARDIOLOGY.

## 2016-01-04 NOTE — Telephone Encounter (Signed)
Pt is on hold until seen by cardiology. She is aware

## 2016-01-04 NOTE — Telephone Encounter (Signed)
REVIEWED-NO ADDITIONAL RECOMMENDATIONS. 

## 2016-01-04 NOTE — Telephone Encounter (Signed)
Pt is calling because her blood pressure is high and her PCP told her that we would not do her TCS because of that. She is wanting to know if she can still have the TCS with her BP that high. She had to go to the ER last night. Please advise

## 2016-01-10 ENCOUNTER — Encounter: Payer: Self-pay | Admitting: Physician Assistant

## 2016-01-10 ENCOUNTER — Ambulatory Visit: Payer: Self-pay | Admitting: Physician Assistant

## 2016-01-10 VITALS — BP 140/90 | HR 57 | Temp 98.1°F | Ht 65.0 in | Wt 148.8 lb

## 2016-01-10 DIAGNOSIS — I10 Essential (primary) hypertension: Secondary | ICD-10-CM

## 2016-01-10 DIAGNOSIS — K219 Gastro-esophageal reflux disease without esophagitis: Secondary | ICD-10-CM

## 2016-01-10 DIAGNOSIS — R519 Headache, unspecified: Secondary | ICD-10-CM

## 2016-01-10 DIAGNOSIS — R1084 Generalized abdominal pain: Secondary | ICD-10-CM

## 2016-01-10 DIAGNOSIS — B182 Chronic viral hepatitis C: Secondary | ICD-10-CM

## 2016-01-10 DIAGNOSIS — R55 Syncope and collapse: Secondary | ICD-10-CM

## 2016-01-10 DIAGNOSIS — G8929 Other chronic pain: Secondary | ICD-10-CM

## 2016-01-10 DIAGNOSIS — N189 Chronic kidney disease, unspecified: Secondary | ICD-10-CM

## 2016-01-10 DIAGNOSIS — R51 Headache: Secondary | ICD-10-CM

## 2016-01-10 DIAGNOSIS — F17219 Nicotine dependence, cigarettes, with unspecified nicotine-induced disorders: Secondary | ICD-10-CM

## 2016-01-10 MED ORDER — PANTOPRAZOLE SODIUM 40 MG PO TBEC: 40.0000 mg | DELAYED_RELEASE_TABLET | Freq: Every day | ORAL | 3 refills | Status: DC

## 2016-01-10 NOTE — Progress Notes (Signed)
BP 140/90 (BP Location: Left Arm, Patient Position: Sitting, Cuff Size: Normal)   Pulse (!) 57   Temp 98.1 F (36.7 C)   Ht 5\' 5"  (1.651 m)   Wt 148 lb 12.8 oz (67.5 kg)   SpO2 96%   BMI 24.76 kg/m    Subjective:    Patient ID: Jody Alvarez, female    DOB: [birth year], 60 y.o.   MRN: [id number]  HPI: Jody Alvarez is a 60 y.o. female presenting on 01/10/2016 for Hypertension   HPI   Pt went to ER last week after her OV due to her BP > 200 with CP.  Pt was given rx for prevacid.  She did not get the prevacid from the pharmacy b/c it was $90.   She did start the clonidine that she was given in the ER  Pt states she has had 2 more syncopal episodes.  She persists with the HA which is why she continues to take the ibuprofen  She says she hasn't heard from Cardiology (was referred due to cp and htn)  Relevant past medical, surgical, family and social history reviewed and updated as indicated. Interim medical history since our last visit reviewed. Allergies and medications reviewed and updated.   Current Outpatient Prescriptions:  .  cloNIDine (CATAPRES) 0.1 MG tablet, Take 1 tablet (0.1 mg total) by mouth 2 (two) times daily as needed (systolic blood pressure >190). (Patient taking differently: Take 0.1 mg by mouth daily. ), Disp: 10 tablet, Rfl: 0 .  diphenhydramine-acetaminophen (TYLENOL PM) 25-500 MG TABS tablet, Take 1 tablet by mouth at bedtime as needed (sleep)., Disp: , Rfl:  .  hydrochlorothiazide (HYDRODIURIL) 25 MG tablet, Take 1 tablet (25 mg total) by mouth daily., Disp: 30 tablet, Rfl: 1 .  ibuprofen (ADVIL,MOTRIN) 800 MG tablet, Take 800 mg by mouth every 12 (twelve) hours as needed., Disp: , Rfl:  .  lisinopril (PRINIVIL,ZESTRIL) 20 MG tablet, Take 2 tablets (40 mg total) by mouth daily., Disp: 60 tablet, Rfl: 1 .  loperamide (IMODIUM) 2 MG capsule, Take 2 mg by mouth as needed for diarrhea or loose stools., Disp: , Rfl:  .  metoprolol (LOPRESSOR) 100 MG tablet, Take 1  tablet (100 mg total) by mouth 2 (two) times daily., Disp: 60 tablet, Rfl: 1 .  Tetrahydroz-Glyc-Hyprom-PEG (VISINE MAXIMUM REDNESS RELIEF) 0.05-0.2-0.36-1 % SOLN, Apply 1 drop to eye daily as needed (for redness relief)., Disp: , Rfl:  .  albuterol (PROVENTIL HFA;VENTOLIN HFA) 108 (90 Base) MCG/ACT inhaler, Inhale 2 puffs into the lungs every 6 (six) hours as needed for wheezing or shortness of breath. (Patient not taking: Reported on 01/10/2016), Disp: 3 Inhaler, Rfl: 0 .  amitriptyline (ELAVIL) 25 MG tablet, Take 25 mg by mouth at bedtime. Reported on 01/03/2016, Disp: , Rfl:  .  lansoprazole (PREVACID) 30 MG capsule, Take 1 capsule (30 mg total) by mouth daily at 12 noon. (Patient not taking: Reported on 01/10/2016), Disp: 30 capsule, Rfl: 0 .  polyethylene glycol-electrolytes (TRILYTE) 420 g solution, Take 4,000 mLs by mouth as directed. (Patient not taking: Reported on 01/03/2016), Disp: 4000 mL, Rfl: 0   Review of Systems  Constitutional: Positive for fatigue. Negative for appetite change, chills, diaphoresis, fever and unexpected weight change.  HENT: Positive for congestion, dental problem, hearing loss and sneezing. Negative for drooling, ear pain, facial swelling, mouth sores, sore throat, trouble swallowing and voice change.   Eyes: Positive for redness. Negative for pain, discharge and itching.  Respiratory: Positive for cough,  choking, shortness of breath and wheezing.   Cardiovascular: Positive for chest pain and palpitations. Negative for leg swelling.  Gastrointestinal: Positive for abdominal pain and diarrhea. Negative for blood in stool, constipation and vomiting.  Endocrine: Positive for heat intolerance. Negative for polydipsia.  Genitourinary: Negative for decreased urine volume, dysuria and hematuria.  Musculoskeletal: Positive for arthralgias, back pain and gait problem.  Skin: Negative for rash.  Allergic/Immunologic: Positive for environmental allergies.  Neurological:  Positive for syncope, light-headedness and headaches. Negative for seizures.  Hematological: Negative for adenopathy.  Psychiatric/Behavioral: Positive for agitation and dysphoric mood. Negative for suicidal ideas. The patient is nervous/anxious.     Per HPI unless specifically indicated above     Objective:    BP 140/90 (BP Location: Left Arm, Patient Position: Sitting, Cuff Size: Normal)   Pulse (!) 57   Temp 98.1 F (36.7 C)   Ht  (1.651 m)   Wt 148 lb 12.8 oz (67.5 kg)   SpO2 96%   BMI 24.76 kg/m   Wt Readings from Last 3 Encounters:  01/10/16 148 lb 12.8 oz (67.5 kg)  01/03/16 150 lb (68 kg)  01/03/16 150 lb (68 kg)    Physical Exam  Constitutional: She is oriented to person, place, and time. She appears well-developed and well-nourished.  HENT:  Head: Normocephalic and atraumatic.  Neck: Neck supple.  Cardiovascular: Normal rate and regular rhythm.   Pulmonary/Chest: Effort normal and breath sounds normal.  Abdominal: Soft. Bowel sounds are normal. She exhibits no mass. There is no hepatosplenomegaly. There is no tenderness.  Musculoskeletal: She exhibits no edema.  Lymphadenopathy:    She has no cervical adenopathy.  Neurological: She is alert and oriented to person, place, and time.  Skin: Skin is warm and dry.  Psychiatric: She has a normal mood and affect. Her behavior is normal.  Vitals reviewed.   Results for orders placed or performed during the hospital encounter of 01/03/16  CBC  Result Value Ref Range   WBC 8.8 4.0 - 10.5 K/uL   RBC 4.36 3.87 - 5.11 MIL/uL   Hemoglobin 13.4 12.0 - 15.0 g/dL   HCT 19.1 47.8 - 29.5 %   MCV 90.4 78.0 - 100.0 fL   MCH 30.7 26.0 - 34.0 pg   MCHC 34.0 30.0 - 36.0 g/dL   RDW 62.1 30.8 - 65.7 %   Platelets 197 150 - 400 K/uL  Basic metabolic panel  Result Value Ref Range   Sodium 137 135 - 145 mmol/L   Potassium 3.8 3.5 - 5.1 mmol/L   Chloride 103 101 - 111 mmol/L   CO2 28 22 - 32 mmol/L   Glucose, Bld 97 65 -  99 mg/dL   BUN 20 6 - 20 mg/dL   Creatinine, Ser 8.46 (H) 0.44 - 1.00 mg/dL   Calcium 8.8 (L) 8.9 - 10.3 mg/dL   GFR calc non Af Amer 52 (L) >60 mL/min   GFR calc Af Amer >60 >60 mL/min   Anion gap 6 5 - 15  Troponin I  Result Value Ref Range   Troponin I <0.03 <0.03 ng/mL  Hepatic function panel  Result Value Ref Range   Total Protein 7.0 6.5 - 8.1 g/dL   Albumin 3.8 3.5 - 5.0 g/dL   AST 24 15 - 41 U/L   ALT 14 14 - 54 U/L   Alkaline Phosphatase 67 38 - 126 U/L   Total Bilirubin 0.4 0.3 - 1.2 mg/dL   Bilirubin, Direct 0.1 0.1 -  0.5 mg/dL   Indirect Bilirubin 0.3 0.3 - 0.9 mg/dL  Lipase, blood  Result Value Ref Range   Lipase 31 11 - 51 U/L  I-stat troponin, ED  Result Value Ref Range   Troponin i, poc 0.00 0.00 - 0.08 ng/mL   Comment 3              Assessment & Plan:   Encounter Diagnoses  Name Primary?  . Essential hypertension, benign Yes  . Chronic nonintractable headache, unspecified headache type   . Syncope, unspecified syncope type   . Generalized abdominal pain   . Cigarette nicotine dependence with nicotine-induced disorder   . Chronic hepatitis C without hepatic coma (HCC)   . CKD (chronic kidney disease), unspecified stage   . Gastroesophageal reflux disease, esophagitis presence not specified     -continue current anti-hypertensive meds for the bp which is much better today -rx protonix- gave coupon -Refer to neurology for syncope and HA -pt counseled to avoid driving in light of the syncope -f/u with GI for EGD/testing -F/u 1 month. RTO sooner prn

## 2016-01-11 ENCOUNTER — Encounter (HOSPITAL_COMMUNITY): Payer: Self-pay

## 2016-01-11 ENCOUNTER — Ambulatory Visit (HOSPITAL_COMMUNITY): Admit: 2016-01-11 | Payer: Self-pay | Admitting: Gastroenterology

## 2016-01-11 SURGERY — COLONOSCOPY WITH PROPOFOL
Anesthesia: Monitor Anesthesia Care

## 2016-01-24 NOTE — Progress Notes (Signed)
LMOM to call.

## 2016-01-24 NOTE — Progress Notes (Signed)
Discussed her Hepatitis B labs [place] [facility] [place], Annamarie Majorawn Drazek, NP. Based on patient's negative hepatitis B surface antigen, negative hepatitis B core IgM patient unlikely has active hepatitis B. Positive total hepatitis B core antibody indicates most likely prior exposure although false positives can occur.  Recommendations are to offer hepatitis B vaccination now. Obtain through the free clinic?? Can offer hepatitis C treatment locally, monitor her LFTs while on treatment and if any increase in LFTs, then would check HBV DNA at that time. Patient has upcoming OV to schedule her TCS+/-EGD with Minerva AreolaEric. Can discuss desires for pursuing HCV treatment once procedures are completed.

## 2016-01-26 ENCOUNTER — Ambulatory Visit: Payer: Self-pay | Admitting: Neurology

## 2016-01-27 NOTE — Progress Notes (Signed)
PT is aware and will keep appt next week.

## 2016-01-31 ENCOUNTER — Ambulatory Visit (INDEPENDENT_AMBULATORY_CARE_PROVIDER_SITE_OTHER): Payer: Self-pay | Admitting: Cardiovascular Disease

## 2016-01-31 ENCOUNTER — Encounter: Payer: Self-pay | Admitting: Cardiovascular Disease

## 2016-01-31 VITALS — BP 160/100 | HR 66 | Ht 66.0 in | Wt 151.0 lb

## 2016-01-31 DIAGNOSIS — R55 Syncope and collapse: Secondary | ICD-10-CM

## 2016-01-31 DIAGNOSIS — R072 Precordial pain: Secondary | ICD-10-CM

## 2016-01-31 DIAGNOSIS — I10 Essential (primary) hypertension: Secondary | ICD-10-CM

## 2016-01-31 DIAGNOSIS — R0609 Other forms of dyspnea: Secondary | ICD-10-CM

## 2016-01-31 MED ORDER — CLONIDINE HCL 0.2 MG PO TABS: 0.2000 mg | ORAL_TABLET | Freq: Two times a day (BID) | ORAL | 6 refills | Status: AC

## 2016-01-31 NOTE — Progress Notes (Signed)
CARDIOLOGY CONSULT NOTE  Patient ID: Jody HaradaLinda Clabo MRN: [id number] DOB/AGE: 60/11/1955 60 y.o.  Admit date: (Not on file) Primary Physician: Jacquelin HawkingShannon McElroy, PA-C Referring Physician:   Reason for Consultation: HTN, syncope, chest pain  HPI: 60 yr old woman referred for chest pain and HTN. Evaluated in ED 01/03/16 for HTN. Troponins negative. ECG showed NSR with possible old anteroseptal infarct. CXR normal. Has chronic headaches and takes high dose ibuprofen.  Has been experiencing exertional dyspnea for several months. Can climb 4 steps before having to stop. Denies exertional chest pain. Has left-sided and right-sided chest pains which occur randomly. Has had 5-6 episodes of passing out in the past month. They used to last 3-4 seconds but now last longer. She is seeing neurology later this month. She denies orthopnea and PND.    Allergies  Allergen Reactions  . Quetiapine Palpitations  . Trazodone And Nefazodone Anxiety and Palpitations  . Flexeril [Cyclobenzaprine] Other (See Comments)    Hallucinations   . Phenergan [Promethazine Hcl] Other (See Comments)    Hallucinations    Current Outpatient Prescriptions  Medication Sig Dispense Refill  . albuterol (PROVENTIL HFA;VENTOLIN HFA) 108 (90 Base) MCG/ACT inhaler Inhale 2 puffs into the lungs every 6 (six) hours as needed for wheezing or shortness of breath. 3 Inhaler 0  . diphenhydramine-acetaminophen (TYLENOL PM) 25-500 MG TABS tablet Take 1 tablet by mouth at bedtime as needed (sleep).    . hydrochlorothiazide (HYDRODIURIL) 25 MG tablet Take 1 tablet (25 mg total) by mouth daily. 30 tablet 1  . ibuprofen (ADVIL,MOTRIN) 800 MG tablet Take 800 mg by mouth every 12 (twelve) hours as needed.    Marland Kitchen. lisinopril (PRINIVIL,ZESTRIL) 20 MG tablet Take 2 tablets (40 mg total) by mouth daily. 60 tablet 1  . loperamide (IMODIUM) 2 MG capsule Take 2 mg by mouth as needed for diarrhea or loose stools.    . metoprolol (LOPRESSOR) 100  MG tablet Take 1 tablet (100 mg total) by mouth 2 (two) times daily. 60 tablet 1  . pantoprazole (PROTONIX) 40 MG tablet Take 1 tablet (40 mg total) by mouth daily. 30 tablet 3  . Tetrahydroz-Glyc-Hyprom-PEG (VISINE MAXIMUM REDNESS RELIEF) 0.05-0.2-0.36-1 % SOLN Apply 1 drop to eye daily as needed (for redness relief).    . cloNIDine (CATAPRES) 0.1 MG tablet Take 1 tablet (0.1 mg total) by mouth 2 (two) times daily as needed (systolic blood pressure >190). (Patient not taking: Reported on 01/31/2016) 10 tablet 0   No current facility-administered medications for this visit.     Past Medical History:  Diagnosis Date  . Anxiety 2008   lost 4 close family members in 4 yrs, "doesn't like to leave house"  . Anxiety    pl[facility] to [facility], has been in past  . Chronic hepatitis C (HCC)   . COPD (chronic obstructive pulmonary disease) (HCC)   . Hypertension     Past Surgical History:  Procedure Laterality Date  . RECTOCELE REPAIR    . VAGINAL HYSTERECTOMY     partial    Social History   Social History  . Marital status: Divorced    Spouse name: N/A  . Number of children: 1  . Years of education: N/A   Occupational History  . unemployed    Social History Main Topics  . Smoking status: Current Every Day Smoker    Packs/day: 1.00    Years: 50.00    Types: Cigarettes  Start date: 01/30/1969  . Smokeless tobacco: Never Used  . Alcohol use No  . 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 Sexual activity: Not Currently    Partners: Male   Other Topics Concern  . Not on file   Social History Narrative  . No narrative on file     No family history of premature CAD in 1st degree relatives.  Prior to Admission medications   Medication Sig Start Date End Date Taking? Authorizing Provider  albuterol (PROVENTIL HFA;VENTOLIN HFA) 108 (90 Base) MCG/ACT inhaler Inhale 2 puffs into the lungs  every 6 (six) hours as needed for wheezing or shortness of breath. Patient not taking: Reported on 01/10/2016 11/02/15   Jacquelin Hawking, PA-C  amitriptyline (ELAVIL) 25 MG tablet Take 25 mg by mouth at bedtime. Reported on 01/03/2016    Historical Provider, MD  cloNIDine (CATAPRES) 0.1 MG tablet Take 1 tablet (0.1 mg total) by mouth 2 (two) times daily as needed (systolic blood pressure >190). Patient taking differently: Take 0.1 mg by mouth daily.  01/03/16   Loren Racer, MD  diphenhydramine-acetaminophen (TYLENOL PM) 25-500 MG TABS tablet Take 1 tablet by mouth at bedtime as needed (sleep).    Historical Provider, MD  hydrochlorothiazide (HYDRODIURIL) 25 MG tablet Take 1 tablet (25 mg total) by mouth daily. 01/03/16   Jacquelin Hawking, PA-C  ibuprofen (ADVIL,MOTRIN) 800 MG tablet Take 800 mg by mouth every 12 (twelve) hours as needed.    Historical Provider, MD  lansoprazole (PREVACID) 30 MG capsule Take 1 capsule (30 mg total) by mouth daily at 12 noon. Patient not taking: Reported on 01/10/2016 01/03/16   Loren Racer, MD  lisinopril (PRINIVIL,ZESTRIL) 20 MG tablet Take 2 tablets (40 mg total) by mouth daily. 01/03/16   Jacquelin Hawking, PA-C  loperamide (IMODIUM) 2 MG capsule Take 2 mg by mouth as needed for diarrhea or loose stools.    Historical Provider, MD  metoprolol (LOPRESSOR) 100 MG tablet Take 1 tablet (100 mg total) by mouth 2 (two) times daily. 11/16/15   Jacquelin Hawking, PA-C  pantoprazole (PROTONIX) 40 MG tablet Take 1 tablet (40 mg total) by mouth daily. 01/10/16   Jacquelin Hawking, PA-C  polyethylene glycol-electrolytes (TRILYTE) 420 g solution Take 4,000 mLs by mouth as directed. Patient not taking: Reported on 01/03/2016 12/30/15   West Bali, MD  Tetrahydroz-Glyc-Hyprom-PEG (VISINE MAXIMUM REDNESS RELIEF) 0.05-0.2-0.36-1 % SOLN Apply 1 drop to eye daily as needed (for redness relief).    Historical Provider, MD     Review of systems complete and found to be negative unless  listed above in HPI     Physical exam Height 5\' 6"  (1.676 m), weight 151 lb (68.5 kg). General: NAD Neck: No JVD, no thyromegaly or thyroid nodule.  Lungs: Clear to auscultation bilaterally with normal respiratory effort. CV: Nondisplaced PMI. Regular rate and rhythm, normal S1/S2, no S3/S4, no murmur.  No peripheral edema.  No carotid bruit.    Abdomen: Soft, nontender, no hepatosplenomegaly, no distention.  Skin: Intact without lesions or rashes.  Neurologic: Alert and oriented x 3.  Psych: Normal affect. Extremities: No clubbing or cyanosis.  HEENT: Normal.   ECG: Most recent ECG reviewed.  Labs:   Lab Results  Component Value Date   WBC 8.8 01/03/2016   HGB 13.4 01/03/2016   HCT 39.4 01/03/2016   MCV 90.4 01/03/2016   PLT 197 01/03/2016   No results for  input(s): NA, K, CL, CO2, BUN, CREATININE, CALCIUM, PROT, BILITOT, ALKPHOS, ALT, AST, GLUCOSE in the last 168 hours.  Invalid input(s): LABALBU Lab Results  Component Value Date   TROPONINI <0.03 01/03/2016    Lab Results  Component Value Date   CHOL 169 10/02/2015   Lab Results  Component Value Date   HDL 63 10/02/2015   Lab Results  Component Value Date   LDLCALC 91 10/02/2015   Lab Results  Component Value Date   TRIG 73 10/02/2015   Lab Results  Component Value Date   CHOLHDL 2.7 10/02/2015   No results found for: LDLDIRECT       Studies: No results found.  ASSESSMENT AND PLAN:  1. Chest pain/DOE: Atypical symptoms but markedly hypertensive. CP/DOE may be related to severe uncontrolled HTN. I will proceed with a nuclear myocardial perfusion imaging study (Lexiscan) to evaluate for ischemic heart disease. I will order a 2-D echocardiogram with Doppler to evaluate cardiac structure, function, and regional wall motion.  2. Essential HTN: Elevated on multiple meds. Increase clonidine to 0.2 mg bid  3. Syncope: Being evaluated by neurology. Worrisome given increasing frequency and duration. I  will proceed with a nuclear myocardial perfusion imaging study to evaluate for ischemic heart disease.   Dispo: fu 6 weeks.   Signed: Prentice DockerSuresh Marte Celani, M.D., F.A.C.C.  01/31/2016, 10:48 AM

## 2016-01-31 NOTE — Patient Instructions (Signed)
Your physician recommends that you schedule a follow-up appointment in: 6 weeks   INCREASE Clonidine to 0.2 mg twice a day   Your physician has requested that you have an echocardiogram. Echocardiography is a painless test that uses sound waves to create images of your heart. It provides your doctor with information about the size and shape of your heart and how well your heart's chambers and valves are working. This procedure takes approximately one hour. There are no restrictions for this procedure.     Your physician has requested that you have a lexiscan myoview. For further information please visit [email or web address]. Please follow instruction sheet, as given.     Thank you for choosing [facility] !

## 2016-02-03 ENCOUNTER — Ambulatory Visit: Payer: Self-pay | Admitting: Nurse Practitioner

## 2016-02-06 ENCOUNTER — Other Ambulatory Visit: Payer: Self-pay | Admitting: Physician Assistant

## 2016-02-09 ENCOUNTER — Inpatient Hospital Stay (HOSPITAL_COMMUNITY): Admission: RE | Admit: 2016-02-09 | Payer: Self-pay | Source: Ambulatory Visit

## 2016-02-09 ENCOUNTER — Ambulatory Visit (HOSPITAL_COMMUNITY)
Admission: RE | Admit: 2016-02-09 | Discharge: 2016-02-09 | Disposition: A | Discharge status: Home / Self Care | Payer: Self-pay | Source: Ambulatory Visit | Attending: Cardiovascular Disease | Admitting: Cardiovascular Disease

## 2016-02-09 ENCOUNTER — Ambulatory Visit: Payer: Self-pay | Admitting: Physician Assistant

## 2016-02-09 ENCOUNTER — Encounter (HOSPITAL_COMMUNITY)
Admission: RE | Admit: 2016-02-09 | Discharge: 2016-02-09 | Disposition: A | Discharge status: Home / Self Care | Payer: Self-pay | Source: Ambulatory Visit | Attending: Cardiovascular Disease | Admitting: Cardiovascular Disease

## 2016-02-09 ENCOUNTER — Encounter (HOSPITAL_COMMUNITY): Payer: Self-pay

## 2016-02-09 DIAGNOSIS — R072 Precordial pain: Secondary | ICD-10-CM | POA: Insufficient documentation

## 2016-02-09 DIAGNOSIS — I515 Myocardial degeneration: Secondary | ICD-10-CM | POA: Insufficient documentation

## 2016-02-09 DIAGNOSIS — I071 Rheumatic tricuspid insufficiency: Secondary | ICD-10-CM | POA: Insufficient documentation

## 2016-02-09 DIAGNOSIS — I119 Hypertensive heart disease without heart failure: Secondary | ICD-10-CM | POA: Insufficient documentation

## 2016-02-09 DIAGNOSIS — R931 Abnormal findings on diagnostic imaging of heart and coronary circulation: Secondary | ICD-10-CM | POA: Insufficient documentation

## 2016-02-09 DIAGNOSIS — R0609 Other forms of dyspnea: Secondary | ICD-10-CM | POA: Insufficient documentation

## 2016-02-09 DIAGNOSIS — I358 Other nonrheumatic aortic valve disorders: Secondary | ICD-10-CM | POA: Insufficient documentation

## 2016-02-09 DIAGNOSIS — Z720 Tobacco use: Secondary | ICD-10-CM | POA: Insufficient documentation

## 2016-02-09 LAB — NM MYOCAR MULTI W/SPECT W/WALL MOTION / EF
CHL CUP NUCLEAR SDS: 0
CHL CUP NUCLEAR SRS: 1
CHL CUP RESTING HR STRESS: 59 {beats}/min
CSEPPHR: 81 {beats}/min
LV dias vol: 47 mL (ref 46–106)
LVSYSVOL: 12 mL
RATE: 0.27
SSS: 1

## 2016-02-09 MED ORDER — SODIUM CHLORIDE 0.9% FLUSH
INTRAVENOUS | Status: AC
  Administered 2016-02-09: 10 mL via INTRAVENOUS
  Filled 2016-02-09: qty 10

## 2016-02-09 MED ORDER — TECHNETIUM TC 99M TETROFOSMIN IV KIT
30.0000 | PACK | Freq: Once | INTRAVENOUS | Status: AC | PRN
  Administered 2016-02-09: 30 via INTRAVENOUS

## 2016-02-09 MED ORDER — TECHNETIUM TC 99M TETROFOSMIN IV KIT
10.0000 | PACK | Freq: Once | INTRAVENOUS | Status: AC | PRN
  Administered 2016-02-09: 10 via INTRAVENOUS

## 2016-02-09 MED ORDER — REGADENOSON 0.4 MG/5ML IV SOLN
INTRAVENOUS | Status: AC
  Administered 2016-02-09: 0.4 mg via INTRAVENOUS
  Filled 2016-02-09: qty 5

## 2016-02-09 NOTE — Progress Notes (Signed)
*  PRELIMINARY RESULTS* Echocardiogram 2D Echocardiogram has been performed.  Stacey DrainWhite, Lottie Siska J 02/09/2016, 9:10 AM

## 2016-02-15 ENCOUNTER — Ambulatory Visit: Payer: Self-pay | Admitting: Neurology

## 2016-02-17 ENCOUNTER — Encounter: Payer: Self-pay | Admitting: Neurology

## 2016-02-22 ENCOUNTER — Ambulatory Visit: Payer: Self-pay | Admitting: Physician Assistant

## 2016-02-22 ENCOUNTER — Encounter: Payer: Self-pay | Admitting: Physician Assistant

## 2016-02-22 VITALS — BP 114/82 | HR 58 | Temp 97.7°F | Ht 66.0 in | Wt 150.0 lb

## 2016-02-22 DIAGNOSIS — N189 Chronic kidney disease, unspecified: Secondary | ICD-10-CM

## 2016-02-22 DIAGNOSIS — R55 Syncope and collapse: Secondary | ICD-10-CM

## 2016-02-22 DIAGNOSIS — F17219 Nicotine dependence, cigarettes, with unspecified nicotine-induced disorders: Secondary | ICD-10-CM

## 2016-02-22 DIAGNOSIS — G8929 Other chronic pain: Secondary | ICD-10-CM

## 2016-02-22 DIAGNOSIS — B182 Chronic viral hepatitis C: Secondary | ICD-10-CM

## 2016-02-22 DIAGNOSIS — I10 Essential (primary) hypertension: Secondary | ICD-10-CM

## 2016-02-22 DIAGNOSIS — R51 Headache: Secondary | ICD-10-CM

## 2016-02-22 NOTE — Progress Notes (Signed)
BP 114/82 (BP Location: Left Arm, Patient Position: Sitting, Cuff Size: Normal)   Pulse (!) 58   Temp 97.7 F (36.5 C) (Other (Comment))   Ht 5\' 6"  (1.676 m)   Wt 150 lb (68 kg)   SpO2 98%   BMI 24.21 kg/m    Subjective:    Patient ID: Jody Alvarez, female    DOB: Nov 02, 1955, 60 y.o.   MRN: [id number]  HPI: Jody Alvarez is a 60 y.o. female presenting on 02/22/2016 for Hypertension   HPI   Pt saw cardiology last month and had testing.  She hasn't seen neuro yet- says she has more papers to get turned in prior to being seen.   Pt states HA much better and they don't come every day.  She says she feels like she might pass out when she bends over.  Relevant past medical, surgical, family and social history reviewed and updated as indicated. Interim medical history since our last visit reviewed. Allergies and medications reviewed and updated.   Current Outpatient Prescriptions:  .  albuterol (PROVENTIL HFA;VENTOLIN HFA) 108 (90 Base) MCG/ACT inhaler, Inhale 2 puffs into the lungs every 6 (six) hours as needed for wheezing or shortness of breath., Disp: 3 Inhaler, Rfl: 0 .  cloNIDine (CATAPRES) 0.2 MG tablet, Take 1 tablet (0.2 mg total) by mouth 2 (two) times daily., Disp: 60 tablet, Rfl: 6 .  diphenhydramine-acetaminophen (TYLENOL PM) 25-500 MG TABS tablet, Take 1 tablet by mouth at bedtime as needed (sleep)., Disp: , Rfl:  .  hydrochlorothiazide (HYDRODIURIL) 25 MG tablet, Take 1 tablet (25 mg total) by mouth daily., Disp: 30 tablet, Rfl: 1 .  ibuprofen (ADVIL,MOTRIN) 800 MG tablet, Take 800 mg by mouth every 12 (twelve) hours as needed., Disp: , Rfl:  .  lisinopril (PRINIVIL,ZESTRIL) 20 MG tablet, Take 2 tablets (40 mg total) by mouth daily., Disp: 60 tablet, Rfl: 1 .  loperamide (IMODIUM) 2 MG capsule, Take 2 mg by mouth as needed for diarrhea or loose stools., Disp: , Rfl:  .  metoprolol (LOPRESSOR) 100 MG tablet, TAKE ONE TABLET BY MOUTH TWICE DAILY, Disp: 60 tablet, Rfl: 1 .   pantoprazole (PROTONIX) 40 MG tablet, Take 1 tablet (40 mg total) by mouth daily., Disp: 30 tablet, Rfl: 3 .  Tetrahydroz-Glyc-Hyprom-PEG (VISINE MAXIMUM REDNESS RELIEF) 0.05-0.2-0.36-1 % SOLN, Apply 1 drop to eye daily as needed (for redness relief)., Disp: , Rfl:   Review of Systems  Constitutional: Positive for fatigue. Negative for appetite change, chills, diaphoresis, fever and unexpected weight change.  HENT: Positive for sneezing. Negative for congestion, drooling, ear pain, facial swelling, hearing loss, mouth sores, sore throat, trouble swallowing and voice change.   Eyes: Positive for redness and visual disturbance. Negative for pain, discharge and itching.  Respiratory: Positive for cough, shortness of breath and wheezing. Negative for choking.   Cardiovascular: Positive for chest pain and palpitations. Negative for leg swelling.  Gastrointestinal: Positive for abdominal pain. Negative for blood in stool, constipation, diarrhea and vomiting.  Endocrine: Positive for heat intolerance. Negative for cold intolerance and polydipsia.  Genitourinary: Negative for decreased urine volume, dysuria and hematuria.  Musculoskeletal: Positive for back pain and gait problem. Negative for arthralgias.  Skin: Negative for rash.  Allergic/Immunologic: Negative for environmental allergies.  Neurological: Positive for syncope and light-headedness. Negative for seizures and headaches.  Hematological: Negative for adenopathy.  Psychiatric/Behavioral: Positive for agitation and dysphoric mood. Negative for suicidal ideas. The patient is nervous/anxious.     Per HPI unless specifically  indicated above     Objective:    BP 114/82 (BP Location: Left Arm, Patient Position: Sitting, Cuff Size: Normal)   Pulse (!) 58   Temp 97.7 F (36.5 C) (Other (Comment))   Ht 5\' 6"  (1.676 m)   Wt 150 lb (68 kg)   SpO2 98%   BMI 24.21 kg/m   Wt Readings from Last 3 Encounters:  02/22/16 150 lb (68 kg)  01/31/16  151 lb (68.5 kg)  01/10/16 148 lb 12.8 oz (67.5 kg)    Physical Exam  Constitutional: She is oriented to person, place, and time. She appears well-developed and well-nourished.  HENT:  Head: Normocephalic and atraumatic.  Neck: Neck supple.  Cardiovascular: Normal rate and regular rhythm.   Pulmonary/Chest: Effort normal and breath sounds normal.  Abdominal: Soft. Bowel sounds are normal. She exhibits no mass. There is no hepatosplenomegaly. There is no tenderness.  Musculoskeletal: She exhibits no edema.  Lymphadenopathy:    She has no cervical adenopathy.  Neurological: She is alert and oriented to person, place, and time.  Skin: Skin is warm and dry.  Psychiatric: She has a normal mood and affect. Her behavior is normal.  Vitals reviewed.       Assessment & Plan:    Encounter Diagnoses  Name Primary?  . Essential hypertension, benign Yes  . Cigarette nicotine dependence with nicotine-induced disorder   . Syncope, unspecified syncope type   . Chronic hepatitis C without hepatic coma (HCC)   . CKD (chronic kidney disease), unspecified stage   . Chronic nonintractable headache, unspecified headache type     -Urged pt to get [facility] discount application paperwork turned in  -Pt has appointment with GI and cardiology later this month.   -Neurology appointment awaiting [facility] discount -reminded pt to avoid driving due to syncope  -continue current medications -counseled smoking cessation -f/u 1 month.  RTO sooner prn

## 2016-03-06 ENCOUNTER — Other Ambulatory Visit: Payer: Self-pay

## 2016-03-06 ENCOUNTER — Encounter: Payer: Self-pay | Admitting: Gastroenterology

## 2016-03-06 ENCOUNTER — Encounter: Payer: Self-pay | Admitting: Nurse Practitioner

## 2016-03-06 ENCOUNTER — Ambulatory Visit (INDEPENDENT_AMBULATORY_CARE_PROVIDER_SITE_OTHER): Payer: Self-pay | Admitting: Nurse Practitioner

## 2016-03-06 VITALS — BP 135/88 | HR 49 | Temp 97.4°F | Ht 66.0 in | Wt 145.8 lb

## 2016-03-06 DIAGNOSIS — R195 Other fecal abnormalities: Secondary | ICD-10-CM

## 2016-03-06 DIAGNOSIS — K921 Melena: Secondary | ICD-10-CM

## 2016-03-06 DIAGNOSIS — R197 Diarrhea, unspecified: Secondary | ICD-10-CM

## 2016-03-06 DIAGNOSIS — B182 Chronic viral hepatitis C: Secondary | ICD-10-CM

## 2016-03-06 DIAGNOSIS — R1084 Generalized abdominal pain: Secondary | ICD-10-CM

## 2016-03-06 DIAGNOSIS — R109 Unspecified abdominal pain: Secondary | ICD-10-CM

## 2016-03-06 NOTE — Assessment & Plan Note (Signed)
The patient has reviewed the [facility] patient brochure and is interested in pursuing treatment. I will schedule her for a 4 month follow-up to further discuss.

## 2016-03-06 NOTE — Assessment & Plan Note (Signed)
Symptoms have improved, but not resolved. Symptoms are intermittent. We will proceed with colonoscopy and possible and upper endoscopy as noted above. Of note, PPI seems to help with her symptoms. Return for follow-up in 4 months.

## 2016-03-06 NOTE — Progress Notes (Addendum)
REVIEWED-NO ADDITIONAL RECOMMENDATIONS. Referring Provider: Jacquelin HawkingMcElroy, Shannon, PA-C Primary Care Physician:  Jacquelin HawkingShannon McElroy, PA-C Primary GI:  Dr. Darrick PennaFields  Chief Complaint  Patient presents with  . Follow-up    HPI:   Anthoney HaradaLinda Schwartzman is a 60 y.o. female who presents In follow-up to schedule colonoscopy and upper endoscopy. The patient was last seen in our office 11/23/2015 for hepatitis C, abdominal pain, heme positive stools, new-onset diarrhea. Stools are loose postprandially and associated with urgency, bright red blood per rectum, several bowel movements daily. PCP recommended stool studies but unable to collect dose. Noted black stools, heme positive in April of this year. No GERD symptoms complains of abdominal pain in various locations. Uses NSAIDs about twice daily. 15 pound weight loss over the past year. Recommended abdominal ultrasound related to hep C, stool study for GI pathogen panel, colonoscopy and likely upper endoscopy with propofol. Hepatitis C treatment patient pamphlet was provided to the patient for initial review.  Her procedures were initially scheduled for 01/11/2016 before canceled due to significantly elevated blood pressure with a diastolic blood pressure in the 130s. She was started on medication, was having chest pain with her hypertensive urgency. Therefore, the procedures were put on hold until she can be evaluated by cardiology. Cardiology completed 01/31/2016 which found chest pain and dyspnea on exertion in the setting of marketed hypertensive state and likely due to that. A nuclear myocardial perfusion imaging study was ordered to evaluate for ischemic heart disease as well as a 2-D echo. Occasions were altered. 2-D echo completed 02/09/2016 found to be normal. Lexiscan completed 02/09/2016 without significant perfusion abnormalities, deemed low risk study.  Today her blood pressure is much better controlled 135/88. Today she states she's doing well. Chest pain and  DOE resolved with better BP control. Denies any further chest pain. Occasional shortness of breath with COPD history, which is at baseline. Abdominal pain has improved somewhat (but not resolved) with GERD medications. No more hematochezia or melena since last visit. Diarrhea is improved. Has never had a colonoscopy before. Denies any other upper or lower GI symptoms.  Is still taking Ibuprofen, but not as often due to less frequent headaches with better BP control.  Past Medical History:  Diagnosis Date  . Anxiety 2008   lost 4 close family members in 4 yrs, "doesn't like to leave house"  . Anxiety    pl[facility], has been in past  . Chronic hepatitis C (HCC)   . COPD (chronic obstructive pulmonary disease) (HCC)   . Hypertension     Past Surgical History:  Procedure Laterality Date  . RECTOCELE REPAIR    . VAGINAL HYSTERECTOMY     partial    Current Outpatient Prescriptions  Medication Sig Dispense Refill  . albuterol (PROVENTIL HFA;VENTOLIN HFA) 108 (90 Base) MCG/ACT inhaler Inhale 2 puffs into the lungs every 6 (six) hours as needed for wheezing or shortness of breath. 3 Inhaler 0  . cloNIDine (CATAPRES) 0.2 MG tablet Take 1 tablet (0.2 mg total) by mouth 2 (two) times daily. 60 tablet 6  . diphenhydramine-acetaminophen (TYLENOL PM) 25-500 MG TABS tablet Take 1 tablet by mouth at bedtime as needed (sleep).    . hydrochlorothiazide (HYDRODIURIL) 25 MG tablet Take 1 tablet (25 mg total) by mouth daily. 30 tablet 1  . ibuprofen (ADVIL,MOTRIN) 800 MG tablet Take 800 mg by mouth every 12 (twelve) hours as needed.    Marland Kitchen. lisinopril (PRINIVIL,ZESTRIL) 20 MG tablet Take 2  tablets (40 mg total) by mouth daily. 60 tablet 1  . loperamide (IMODIUM) 2 MG capsule Take 2 mg by mouth as needed for diarrhea or loose stools.    . metoprolol (LOPRESSOR) 100 MG tablet TAKE ONE TABLET BY MOUTH TWICE DAILY 60 tablet 1  . pantoprazole (PROTONIX) 40 MG tablet Take 1 tablet  (40 mg total) by mouth daily. 30 tablet 3  . Tetrahydroz-Glyc-Hyprom-PEG (VISINE MAXIMUM REDNESS RELIEF) 0.05-0.2-0.36-1 % SOLN Apply 1 drop to eye daily as needed (for redness relief).     No current facility-administered medications for this visit.     Allergies as of 03/06/2016 - Review Complete 03/06/2016  Allergen Reaction Noted  . Quetiapine Palpitations 12/31/2015  . Trazodone and nefazodone Anxiety and Palpitations 09/14/2015  . Flexeril [cyclobenzaprine] Other (See Comments) 10/04/2015  . Phenergan [promethazine hcl] Other (See Comments) 10/04/2015    Family History  Problem Relation Age of Onset  . Diabetes Mother   . Cancer Mother     uterine  . Cancer Father     unknown primary  . Cancer Sister     pancreatic  . Cancer Sister     cervical  . Cancer Sister     cervical  . Cancer Maternal Grandfather     lung  . Colon cancer Neg Hx   . Liver disease Neg Hx     Social History   Social History  . Marital status: Divorced    Spouse name: N/A  . Number of children: 1  . Years of education: N/A   Occupational History  . unemployed    Social History Main Topics  . Smoking status: Current Every Day Smoker    Packs/day: 1.00    Years: 50.00    Types: Cigarettes    Start date: 01/30/1969  . Smokeless tobacco: Never Used     Comment: would like to try Chantix  . Alcohol use No  . 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 Sexual activity: Not Currently    Partners: Male   Other Topics Concern  . None   Social History Narrative  . None    Review of Systems: 10-point ROS negative except as per HPI.   Physical Exam: BP 135/88   Pulse (!) 49   Temp 97.4 F (36.3 C) (Oral)   Ht 5\' 6"  (1.676 m)   Wt 145 lb 12.8 oz (66.1 kg)   BMI 23.53 kg/m  General:   Alert and oriented. Pleasant and cooperative. Well-nourished and well-developed.  Eyes:  Without icterus, sclera clear and conjunctiva  pink.  Ears:  Normal auditory acuity. Cardiovascular:  S1, S2 present without murmurs appreciated. Extremities without clubbing or edema. Respiratory:  Clear to auscultation bilaterally. No wheezes, rales, or rhonchi. No distress.  Gastrointestinal:  +BS, soft, non-tender and non-distended. No HSM noted. No guarding or rebound. No masses appreciated.  Rectal:  Deferred  Musculoskalatal:  Symmetrical without gross deformities. Neurologic:  Alert and oriented x4;  grossly normal neurologically. Psych:  Alert and cooperative. Normal mood and affect. Heme/Lymph/Immune: No excessive bruising noted.    03/06/2016 11:24 AM   Disclaimer: This note was dictated with voice recognition software. Similar sounding words can inadvertently be transcribed and may not be corrected upon review.

## 2016-03-06 NOTE — Patient Instructions (Signed)
1. We will schedule your procedures for you. 2. Return for follow-up in 4 months to discuss hepatitis C and possible treatment.

## 2016-03-06 NOTE — Assessment & Plan Note (Signed)
Patient with noted heme positive stool prior to last visit 1 colonoscopy with possible upper endoscopy was scheduled. This had to be delayed due to high blood pressure. Her blood pressure is much better controlled. No further noted obvious GI bleed. We'll proceed with the procedures as previously recommended.  Proceed with colonoscopy +/- EGD with Dr. Darrick PennaFields with propofol/MAC in the near future. The risks, benefits, and alternatives have been discussed in detail with the patient. They state understanding and desire to proceed.   The patient has a history of polysubstance abuse. No anticoagulants, anxiolytics, chronic pain medications, or antidepressants. We will proceed with the procedure and the OR on propofol/MAC to promote adequate sedation.

## 2016-03-06 NOTE — Assessment & Plan Note (Signed)
Symptoms have essentially resolved at this time. Continue to monitor. Further workup with colonoscopy and possible upper endoscopy as noted above. Return for follow-up in 4 months.

## 2016-03-06 NOTE — Progress Notes (Signed)
cc'ed to pcp °

## 2016-03-10 ENCOUNTER — Ambulatory Visit (INDEPENDENT_AMBULATORY_CARE_PROVIDER_SITE_OTHER): Payer: Self-pay | Admitting: Cardiovascular Disease

## 2016-03-10 ENCOUNTER — Encounter: Payer: Self-pay | Admitting: Cardiovascular Disease

## 2016-03-10 VITALS — BP 112/64 | HR 69 | Ht 66.0 in | Wt 147.0 lb

## 2016-03-10 DIAGNOSIS — R072 Precordial pain: Secondary | ICD-10-CM

## 2016-03-10 DIAGNOSIS — R0609 Other forms of dyspnea: Secondary | ICD-10-CM

## 2016-03-10 DIAGNOSIS — Z136 Encounter for screening for cardiovascular disorders: Secondary | ICD-10-CM

## 2016-03-10 DIAGNOSIS — I10 Essential (primary) hypertension: Secondary | ICD-10-CM

## 2016-03-10 DIAGNOSIS — IMO0001 Reserved for inherently not codable concepts without codable children: Secondary | ICD-10-CM

## 2016-03-10 DIAGNOSIS — R55 Syncope and collapse: Secondary | ICD-10-CM

## 2016-03-10 NOTE — Progress Notes (Signed)
SUBJECTIVE: The patient returns for follow-up after undergoing cardiovascular testing performed for the evaluation of chest pain and syncope.  Nuclear stress testing 02/09/16 was normal.  Echocardiogram 02/09/16 vigorous left ventricular systolic function, LVEF 65-70%, grade 1 diastolic dysfunction, mild LVH.  No longer has chest pain. Bought a blood pressure cuff and has been recording it daily. It now fluctuates with some hypotensive readings and some hypertensive readings but systolic is no longer consistently over 170. Headaches are gone. Doesn't take BP meds when SBP is low.  Has yet to see neurology.    Review of Systems: As per "subjective", otherwise negative.  Allergies  Allergen Reactions  . Quetiapine Palpitations  . Trazodone And Nefazodone Anxiety and Palpitations  . Flexeril [Cyclobenzaprine] Other (See Comments)    Hallucinations   . Phenergan [Promethazine Hcl] Other (See Comments)    Hallucinations    Current Outpatient Prescriptions  Medication Sig Dispense Refill  . albuterol (PROVENTIL HFA;VENTOLIN HFA) 108 (90 Base) MCG/ACT inhaler Inhale 2 puffs into the lungs every 6 (six) hours as needed for wheezing or shortness of breath. 3 Inhaler 0  . cloNIDine (CATAPRES) 0.2 MG tablet Take 1 tablet (0.2 mg total) by mouth 2 (two) times daily. 60 tablet 6  . diphenhydramine-acetaminophen (TYLENOL PM) 25-500 MG TABS tablet Take 1 tablet by mouth at bedtime as needed (sleep).    . hydrochlorothiazide (HYDRODIURIL) 25 MG tablet Take 1 tablet (25 mg total) by mouth daily. 30 tablet 1  . ibuprofen (ADVIL,MOTRIN) 800 MG tablet Take 800 mg by mouth every 12 (twelve) hours as needed.    Marland Kitchen. lisinopril (PRINIVIL,ZESTRIL) 20 MG tablet Take 2 tablets (40 mg total) by mouth daily. 60 tablet 1  . loperamide (IMODIUM) 2 MG capsule Take 2 mg by mouth as needed for diarrhea or loose stools.    . metoprolol (LOPRESSOR) 100 MG tablet TAKE ONE TABLET BY MOUTH TWICE DAILY 60 tablet 1    . pantoprazole (PROTONIX) 40 MG tablet Take 1 tablet (40 mg total) by mouth daily. 30 tablet 3  . Tetrahydroz-Glyc-Hyprom-PEG (VISINE MAXIMUM REDNESS RELIEF) 0.05-0.2-0.36-1 % SOLN Apply 1 drop to eye daily as needed (for redness relief).     No current facility-administered medications for this visit.     Past Medical History:  Diagnosis Date  . Anxiety 2008   lost 4 close family members in 4 yrs, "doesn't like to leave house"  . Anxiety    pl[facility] to [facility], has been in past  . Chronic hepatitis C (HCC)   . COPD (chronic obstructive pulmonary disease) (HCC)   . Hypertension     Past Surgical History:  Procedure Laterality Date  . RECTOCELE REPAIR    . VAGINAL HYSTERECTOMY     partial    Social History   Social History  . Marital status: Divorced    Spouse name: N/A  . Number of children: 1  . Years of education: N/A   Occupational History  . unemployed    Social History Main Topics  . Smoking status: Current Every Day Smoker    Packs/day: 0.50    Years: 50.00    Types: Cigarettes    Start date: 01/30/1969  . Smokeless tobacco: Never Used     Comment: would like to try Chantix  . Alcohol use No  . 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 Sexual activity: Not  Currently    Partners: Male   Other Topics Concern  . Not on file   Social History Narrative  . No narrative on file     Vitals:   03/10/16 1109  BP: 112/64  Pulse: 69  SpO2: 92%  Weight: 147 lb (66.7 kg)  Height: 5\' 6"  (1.676 m)    PHYSICAL EXAM General: NAD HEENT: Normal. Neck: No JVD, no thyromegaly. Lungs: Clear to auscultation bilaterally with normal respiratory effort. CV: Nondisplaced PMI.  Regular rate and rhythm, normal S1/S2, no S3/S4, no murmur. No pretibial or periankle edema.   Abdomen: Soft, nontender, no distention.  Neurologic: Alert and oriented.  Psych: Normal affect. Skin:  Normal. Musculoskeletal: No gross deformities.    ECG: Most recent ECG reviewed.      ASSESSMENT AND PLAN: 1. Chest pain/DOE: Atypical symptoms but had been markedly hypertensive. CP/DOE may have been related to severe uncontrolled HTN. Nuclear myocardial perfusion imaging study (Lexiscan) was normal. BP now controlled with some fluctuations at home.  2. Essential HTN: Now controlled with increase of clonidine to 0.2 mg bid. No changes. She does not take BP meds when it is low.  3. Syncope: Yet to be evaluated by neurology. Normal stress test and LV function.  Dispo: fu prn.   Prentice Docker, M.D., F.A.C.C.

## 2016-03-10 NOTE — Patient Instructions (Signed)
Your physician recommends that you schedule a follow-up appointment in:  As needed        Thank you for choosing [facility] !         

## 2016-03-13 ENCOUNTER — Other Ambulatory Visit: Payer: Self-pay | Admitting: Physician Assistant

## 2016-03-16 NOTE — Patient Instructions (Signed)
Jody Alvarez  03/16/2016     @PREFPERIOPPHARMACY @   Your procedure is scheduled on  03/21/2016   Report to [facility] at  1115  A.M.  Call this number if you have problems the morning of surgery:  [phone]   Remember:  Do not eat food or drink liquids after midnight.  Take these medicines the morning of surgery with A SIP OF WATER  Catapress, lisinopril, metoprolol, protonix. Take your inhaler before you come and bring it with you.   Do not wear jewelry, make-up or nail polish.  Do not wear lotions, powders, or perfumes, or deoderant.  Do not shave 48 hours prior to surgery.  Men may shave face and neck.  Do not bring valuables to the hospital.  [facility] is not responsible for any belongings or valuables.  Contacts, dentures or bridgework may not be worn into surgery.  Leave your suitcase in the car.  After surgery it may be brought to your room.  For patients admitted to the hospital, discharge time will be determined by your treatment team.  Patients discharged the day of surgery will not be allowed to drive home.   Name and phone number of your driver:   family Special instructions:  Follow the diet and prep instructions given to you by Dr Evelina Dun office.  Please read over the following fact sheets that you were given. Anesthesia Post-op Instructions and Care and Recovery After Surgery      Esophagogastroduodenoscopy Esophagogastroduodenoscopy (EGD) is a procedure that is used to examine the lining of the esophagus, stomach, and first part of the small intestine (duodenum). A long, flexible, lighted tube with a camera attached (endoscope) is inserted down the throat to view these organs. This procedure is done to detect problems or abnormalities, such as inflammation, bleeding, ulcers, or growths, in order to treat them. The procedure lasts 5-20 minutes. It is usually an outpatient procedure, but it may need to be performed in a hospital in emergency  cases. LET [facility] CARE PROVIDER KNOW ABOUT:  Any allergies you have.  All medicines you are taking, including vitamins, herbs, eye drops, creams, and over-the-counter medicines.  Previous problems you or members of your family have had with the use of anesthetics.  Any blood disorders you have.  Previous surgeries you have had.  Medical conditions you have. RISKS AND COMPLICATIONS Generally, this is a safe procedure. However, problems can occur and include:  Infection.  Bleeding.  Tearing (perforation) of the esophagus, stomach, or duodenum.  Difficulty breathing or not being able to breathe.  Excessive sweating.  Spasms of the larynx.  Slowed heartbeat.  Low blood pressure. BEFORE THE PROCEDURE  Do not eat or drink anything after midnight on the night before the procedure or as directed by your health care provider.  Do not take your regular medicines before the procedure if your health care provider asks you not to. Ask your health care provider about changing or stopping those medicines.  If you wear dentures, be prepared to remove them before the procedure.  Arrange for someone to drive you home after the procedure. PROCEDURE  A numbing medicine (local anesthetic) may be sprayed in your throat for comfort and to stop you from gagging or coughing.  You will have an IV tube inserted in a vein in your hand or arm. You will receive medicines and fluids through this tube.  You will be given a medicine  to relax you (sedative).  A pain reliever will be given through the IV tube.  A mouth guard may be placed in your mouth to protect your teeth and to keep you from biting on the endoscope.  You will be asked to lie on your left side.  The endoscope will be inserted down your throat and into your esophagus, stomach, and duodenum.  Air will be put through the endoscope to allow your health care provider to clearly view the lining of your esophagus.  The lining  of your esophagus, stomach, and duodenum will be examined. During the exam, your health care provider may:  Remove tissue to be examined under a microscope (biopsy) for inflammation, infection, or other medical problems.  Remove growths.  Remove objects (foreign bodies) that are stuck.  Treat any bleeding with medicines or other devices that stop tissues from bleeding (hot cautery, clipping devices).  Widen (dilate) or stretch narrowed areas of your esophagus and stomach.  The endoscope will be withdrawn. AFTER THE PROCEDURE  You will be taken to a recovery area for observation. Your blood pressure, heart rate, breathing rate, and blood oxygen level will be monitored often until the medicines you were given have worn off.  Do not eat or drink anything until the numbing medicine has worn off and your gag reflex has returned. You may choke.  Your health care provider should be able to discuss his or her findings with you. It will take longer to discuss the test results if any biopsies were taken.   This information is not intended to replace advice given to you by your health care provider. Make sure you discuss any questions you have with your health care provider.   Document Released: 10/06/2004 Document Revised: 06/26/2014 Document Reviewed: 05/08/2012 Elsevier Interactive Patient Education 2016 [employer]. Esophagogastroduodenoscopy, Care After Refer to this sheet in the next few weeks. These instructions provide you with information about caring for yourself after your procedure. Your health care provider may also give you more specific instructions. Your treatment has been planned according to current medical practices, but problems sometimes occur. Call your health care provider if you have any problems or questions after your procedure. WHAT TO EXPECT AFTER THE PROCEDURE After your procedure, it is typical to feel:  Soreness in your throat.  Pain with swallowing.  Sick to  your stomach (nauseous).  Bloated.  Dizzy.  Fatigued. HOME CARE INSTRUCTIONS  Do not eat or drink anything until the numbing medicine (local anesthetic) has worn off and your gag reflex has returned. You will know that the local anesthetic has worn off when you can swallow comfortably.  Do not drive or operate machinery until directed by your health care provider.  Take medicines only as directed by your health care provider. SEEK MEDICAL CARE IF:   You cannot stop coughing.  You are not urinating at all or less than usual. SEEK IMMEDIATE MEDICAL CARE IF:  You have difficulty swallowing.  You cannot eat or drink.  You have worsening throat or chest pain.  You have dizziness or lightheadedness or you faint.  You have nausea or vomiting.  You have chills.  You have a fever.  You have severe abdominal pain.  You have black, tarry, or bloody stools.   This information is not intended to replace advice given to you by your health care provider. Make sure you discuss any questions you have with your health care provider.   Document Released: 05/22/2012 Document Revised: 06/26/2014  Document Reviewed: 05/22/2012 Elsevier Interactive Patient Education [employer].  Colonoscopy A colonoscopy is an exam to look at the entire large intestine (colon). This exam can help find problems such as tumors, polyps, inflammation, and areas of bleeding. The exam takes about 1 hour.  LET [facility] CARE PROVIDER KNOW ABOUT:   Any allergies you have.  All medicines you are taking, including vitamins, herbs, eye drops, creams, and over-the-counter medicines.  Previous problems you or members of your family have had with the use of anesthetics.  Any blood disorders you have.  Previous surgeries you have had.  Medical conditions you have. RISKS AND COMPLICATIONS  Generally, this is a safe procedure. However, as with any procedure, complications can occur. Possible  complications include:  Bleeding.  Tearing or rupture of the colon wall.  Reaction to medicines given during the exam.  Infection (rare). BEFORE THE PROCEDURE   Ask your health care provider about changing or stopping your regular medicines.  You may be prescribed an oral bowel prep. This involves drinking a large amount of medicated liquid, starting the day before your procedure. The liquid will cause you to have multiple loose stools until your stool is almost clear or light green. This cleans out your colon in preparation for the procedure.  Do not eat or drink anything else once you have started the bowel prep, unless your health care provider tells you it is safe to do so.  Arrange for someone to drive you home after the procedure. PROCEDURE   You will be given medicine to help you relax (sedative).  You will lie on your side with your knees bent.  A long, flexible tube with a light and camera on the end (colonoscope) will be inserted through the rectum and into the colon. The camera sends video back to a computer screen as it moves through the colon. The colonoscope also releases carbon dioxide gas to inflate the colon. This helps your health care provider see the area better.  During the exam, your health care provider may take a small tissue sample (biopsy) to be examined under a microscope if any abnormalities are found.  The exam is finished when the entire colon has been viewed. AFTER THE PROCEDURE   Do not drive for 24 hours after the exam.  You may have a small amount of blood in your stool.  You may pass moderate amounts of gas and have mild abdominal cramping or bloating. This is caused by the gas used to inflate your colon during the exam.  Ask when your test results will be ready and how you will get your results. Make sure you get your test results.   This information is not intended to replace advice given to you by your health care provider. Make sure you  discuss any questions you have with your health care provider.   Document Released: 06/02/2000 Document Revised: 03/26/2013 Document Reviewed: 02/10/2013 Elsevier Interactive Patient Education 2016 Elsevier Inc[employer] Colonoscopy, Care After Refer to this sheet in the next few weeks. These instructions provide you with information on caring for yourself after your procedure. Your health care provider may also give you more specific instructions. Your treatment has been planned according to current medical practices, but problems sometimes occur. Call your health care provider if you have any problems or questions after your procedure. WHAT TO EXPECT AFTER THE PROCEDURE  After your procedure, it is typical to have the following:  A small amount of blood in your  stool.  Moderate amounts of gas and mild abdominal cramping or bloating. HOME CARE INSTRUCTIONS  Do not drive, operate machinery, or sign important documents for 24 hours.  You may shower and resume your regular physical activities, but move at a slower pace for the first 24 hours.  Take frequent rest periods for the first 24 hours.  Walk around or put a warm pack on your abdomen to help reduce abdominal cramping and bloating.  Drink enough fluids to keep your urine clear or pale yellow.  You may resume your normal diet as instructed by your health care provider. Avoid heavy or fried foods that are hard to digest.  Avoid drinking alcohol for 24 hours or as instructed by your health care provider.  Only take over-the-counter or prescription medicines as directed by your health care provider.  If a tissue sample (biopsy) was taken during your procedure:  Do not take aspirin or blood thinners for 7 days, or as instructed by your health care provider.  Do not drink alcohol for 7 days, or as instructed by your health care provider.  Eat soft foods for the first 24 hours. SEEK MEDICAL CARE IF: You have persistent spotting of blood in  your stool 2-3 days after the procedure. SEEK IMMEDIATE MEDICAL CARE IF:  You have more than a small spotting of blood in your stool.  You pass large blood clots in your stool.  Your abdomen is swollen (distended).  You have nausea or vomiting.  You have a fever.  You have increasing abdominal pain that is not relieved with medicine.   This information is not intended to replace advice given to you by your health care provider. Make sure you discuss any questions you have with your health care provider.   Document Released: 01/18/2004 Document Revised: 03/26/2013 Document Reviewed: 02/10/2013 Elsevier Interactive Patient Education 2016 Elsevier Inc. Monitored Anesthesia Care Monitored anesthesia care is an anesthesia service for a medical procedure. Anesthesia is the loss of the ability to feel pain. It is produced by medicines called anesthetics. It may affect a small area of your body (local anesthesia), a large area of your body (regional anesthesia), or your entire body (general anesthesia). The need for monitored anesthesia care depends your procedure, your condition, and the potential need for regional or general anesthesia. It is often provided during procedures where:   General anesthesia may be needed if there are complications. This is because you need special care when you are under general anesthesia.   You will be under local or regional anesthesia. This is so that you are able to have higher levels of anesthesia if needed.   You will receive calming medicines (sedatives). This is especially the case if sedatives are given to put you in a semi-conscious state of relaxation (deep sedation). This is because the amount of sedative needed to produce this state can be hard to predict. Too much of a sedative can produce general anesthesia. Monitored anesthesia care is performed by one or more health care providers who have special training in all types of anesthesia. You will  need to meet with these health care providers before your procedure. During this meeting, they will ask you about your medical history. They will also give you instructions to follow. (For example, you will need to stop eating and drinking before your procedure. You may also need to stop or change medicines you are taking.) During your procedure, your health care providers will stay with you. They will:  Watch your condition. This includes watching your blood pressure, breathing, and level of pain.   Diagnose and treat problems that occur.   Give medicines if they are needed. These may include calming medicines (sedatives) and anesthetics.   Make sure you are comfortable.  Having monitored anesthesia care does not necessarily mean that you will be under anesthesia. It does mean that your health care providers will be able to manage anesthesia if you need it or if it occurs. It also means that you will be able to have a different type of anesthesia than you are having if you need it. When your procedure is complete, your health care providers will continue to watch your condition. They will make sure any medicines wear off before you are allowed to go home.    This information is not intended to replace advice given to you by your health care provider. Make sure you discuss any questions you have with your health care provider.   Document Released: 03/01/2005 Document Revised: 06/26/2014 Document Reviewed: 07/17/2012 Elsevier Interactive Patient Education [employer]

## 2016-03-17 ENCOUNTER — Encounter (HOSPITAL_COMMUNITY)
Admission: RE | Admit: 2016-03-17 | Discharge: 2016-03-17 | Disposition: A | Discharge status: Home / Self Care | Payer: Self-pay | Source: Ambulatory Visit | Attending: Gastroenterology | Admitting: Gastroenterology

## 2016-03-17 ENCOUNTER — Encounter (HOSPITAL_COMMUNITY): Payer: Self-pay

## 2016-03-17 DIAGNOSIS — Z01818 Encounter for other preprocedural examination: Secondary | ICD-10-CM | POA: Insufficient documentation

## 2016-03-17 DIAGNOSIS — R112 Nausea with vomiting, unspecified: Secondary | ICD-10-CM | POA: Insufficient documentation

## 2016-03-17 DIAGNOSIS — R1013 Epigastric pain: Secondary | ICD-10-CM | POA: Insufficient documentation

## 2016-03-17 HISTORY — DX: Gastro-esophageal reflux disease without esophagitis: K21.9

## 2016-03-17 NOTE — Pre-Procedure Instructions (Signed)
Patient given information to sign up for my chart at home. 

## 2016-03-21 ENCOUNTER — Encounter (HOSPITAL_COMMUNITY): Payer: Self-pay | Admitting: *Deleted

## 2016-03-21 ENCOUNTER — Ambulatory Visit (HOSPITAL_COMMUNITY): Payer: Self-pay | Admitting: Anesthesiology

## 2016-03-21 ENCOUNTER — Encounter (HOSPITAL_COMMUNITY)
Admission: RE | Disposition: A | Discharge status: Home / Self Care | Payer: Self-pay | Source: Ambulatory Visit | Attending: Gastroenterology

## 2016-03-21 ENCOUNTER — Ambulatory Visit (HOSPITAL_COMMUNITY)
Admission: RE | Admit: 2016-03-21 | Discharge: 2016-03-21 | Disposition: A | Discharge status: Home / Self Care | Payer: Self-pay | Source: Ambulatory Visit | Attending: Gastroenterology | Admitting: Gastroenterology

## 2016-03-21 DIAGNOSIS — R1013 Epigastric pain: Secondary | ICD-10-CM | POA: Insufficient documentation

## 2016-03-21 DIAGNOSIS — K219 Gastro-esophageal reflux disease without esophagitis: Secondary | ICD-10-CM | POA: Insufficient documentation

## 2016-03-21 DIAGNOSIS — J449 Chronic obstructive pulmonary disease, unspecified: Secondary | ICD-10-CM | POA: Insufficient documentation

## 2016-03-21 DIAGNOSIS — Z79899 Other long term (current) drug therapy: Secondary | ICD-10-CM | POA: Insufficient documentation

## 2016-03-21 DIAGNOSIS — K2950 Unspecified chronic gastritis without bleeding: Secondary | ICD-10-CM | POA: Insufficient documentation

## 2016-03-21 DIAGNOSIS — Q438 Other specified congenital malformations of intestine: Secondary | ICD-10-CM | POA: Insufficient documentation

## 2016-03-21 DIAGNOSIS — I10 Essential (primary) hypertension: Secondary | ICD-10-CM | POA: Insufficient documentation

## 2016-03-21 DIAGNOSIS — K2970 Gastritis, unspecified, without bleeding: Secondary | ICD-10-CM

## 2016-03-21 DIAGNOSIS — R109 Unspecified abdominal pain: Secondary | ICD-10-CM

## 2016-03-21 DIAGNOSIS — K6289 Other specified diseases of anus and rectum: Secondary | ICD-10-CM

## 2016-03-21 DIAGNOSIS — F17200 Nicotine dependence, unspecified, uncomplicated: Secondary | ICD-10-CM | POA: Insufficient documentation

## 2016-03-21 DIAGNOSIS — R112 Nausea with vomiting, unspecified: Secondary | ICD-10-CM | POA: Insufficient documentation

## 2016-03-21 DIAGNOSIS — K921 Melena: Secondary | ICD-10-CM

## 2016-03-21 DIAGNOSIS — F419 Anxiety disorder, unspecified: Secondary | ICD-10-CM | POA: Insufficient documentation

## 2016-03-21 DIAGNOSIS — B182 Chronic viral hepatitis C: Secondary | ICD-10-CM | POA: Insufficient documentation

## 2016-03-21 DIAGNOSIS — K626 Ulcer of anus and rectum: Secondary | ICD-10-CM | POA: Insufficient documentation

## 2016-03-21 HISTORY — PX: BIOPSY: SHX5522

## 2016-03-21 HISTORY — PX: COLONOSCOPY WITH PROPOFOL: SHX5780

## 2016-03-21 HISTORY — PX: ESOPHAGOGASTRODUODENOSCOPY (EGD) WITH PROPOFOL: SHX5813

## 2016-03-21 SURGERY — COLONOSCOPY WITH PROPOFOL
Anesthesia: Monitor Anesthesia Care

## 2016-03-21 MED ORDER — PROPOFOL 500 MG/50ML IV EMUL
INTRAVENOUS | Status: DC | PRN
  Administered 2016-03-21: 100 ug/kg/min via INTRAVENOUS

## 2016-03-21 MED ORDER — MIDAZOLAM HCL 2 MG/2ML IJ SOLN
INTRAMUSCULAR | Status: AC
  Filled 2016-03-21: qty 2

## 2016-03-21 MED ORDER — OMEPRAZOLE 20 MG PO CPDR: DELAYED_RELEASE_CAPSULE | ORAL | 11 refills | Status: DC

## 2016-03-21 MED ORDER — FENTANYL CITRATE (PF) 100 MCG/2ML IJ SOLN
25.0000 ug | INTRAMUSCULAR | Status: AC | PRN
  Administered 2016-03-21 (×2): 25 ug via INTRAVENOUS

## 2016-03-21 MED ORDER — LIDOCAINE VISCOUS 2 % MT SOLN
OROMUCOSAL | Status: AC
  Filled 2016-03-21: qty 15

## 2016-03-21 MED ORDER — MIDAZOLAM HCL 5 MG/5ML IJ SOLN
INTRAMUSCULAR | Status: DC | PRN
  Administered 2016-03-21: 2 mg via INTRAVENOUS

## 2016-03-21 MED ORDER — GLYCOPYRROLATE 0.2 MG/ML IJ SOLN
0.2000 mg | Freq: Once | INTRAMUSCULAR | Status: AC | PRN
  Administered 2016-03-21: 0.2 mg via INTRAVENOUS
  Filled 2016-03-21: qty 1

## 2016-03-21 MED ORDER — LIDOCAINE VISCOUS 2 % MT SOLN
3.0000 mL | Freq: Two times a day (BID) | OROMUCOSAL | Status: AC
  Administered 2016-03-21 (×2): 3 mL via OROMUCOSAL

## 2016-03-21 MED ORDER — LACTATED RINGERS IV SOLN
INTRAVENOUS | Status: DC
  Administered 2016-03-21: 1000 mL via INTRAVENOUS

## 2016-03-21 MED ORDER — MIDAZOLAM HCL 2 MG/2ML IJ SOLN
1.0000 mg | INTRAMUSCULAR | Status: DC | PRN
  Administered 2016-03-21: 2 mg via INTRAVENOUS
  Filled 2016-03-21: qty 2

## 2016-03-21 MED ORDER — CHLORHEXIDINE GLUCONATE CLOTH 2 % EX PADS: 6.0000 | MEDICATED_PAD | Freq: Once | CUTANEOUS | Status: DC

## 2016-03-21 MED ORDER — FENTANYL CITRATE (PF) 100 MCG/2ML IJ SOLN
INTRAMUSCULAR | Status: AC
  Filled 2016-03-21: qty 2

## 2016-03-21 MED ORDER — LIDOCAINE HCL 2 % EX GEL
CUTANEOUS | Status: AC
  Filled 2016-03-21: qty 30

## 2016-03-21 MED ORDER — PROPOFOL 10 MG/ML IV BOLUS
INTRAVENOUS | Status: AC
  Filled 2016-03-21: qty 40

## 2016-03-21 MED ORDER — PANTOPRAZOLE SODIUM 40 MG PO TBEC: DELAYED_RELEASE_TABLET | ORAL | 3 refills | Status: DC

## 2016-03-21 NOTE — Transfer of Care (Signed)
Immediate Anesthesia Transfer of Care Note  Patient: Jody HaradaLinda Alvarez  Procedure(s) Performed: Procedure(s) with comments: COLONOSCOPY WITH PROPOFOL (N/A) - 115 ESOPHAGOGASTRODUODENOSCOPY (EGD) WITH PROPOFOL (N/A) BIOPSY - gastric   Patient Location: PACU  Anesthesia Type:MAC  Level of Consciousness: awake, alert , oriented and patient cooperative  Airway & Oxygen Therapy: Patient Spontanous Breathing and Patient connected to nasal cannula oxygen  Post-op Assessment: Report given to RN, Post -op Vital signs reviewed and stable and Patient moving all extremities X 4  Post vital signs: Reviewed and stable  Last Vitals:  Vitals:   03/21/16 1045 03/21/16 1156  BP: 95/63 (!) 78/53  Pulse:  (!) 54  Resp: 18 20  Temp:  36.6 C    Last Pain:  Vitals:   03/21/16 0924  TempSrc: Oral      Patients Stated Pain Goal: 8 (03/21/16 0924)  Complications: No apparent anesthesia complications

## 2016-03-21 NOTE — Anesthesia Postprocedure Evaluation (Signed)
Anesthesia Post Note  Patient: Jody HaradaLinda Urieta  Procedure(s) Performed: Procedure(s) (LRB): COLONOSCOPY WITH PROPOFOL (N/A) ESOPHAGOGASTRODUODENOSCOPY (EGD) WITH PROPOFOL (N/A) BIOPSY  Patient location during evaluation: PACU Anesthesia Type: MAC Level of consciousness: awake and alert Pain management: pain level controlled Vital Signs Assessment: post-procedure vital signs reviewed and stable Respiratory status: spontaneous breathing, nonlabored ventilation, respiratory function stable and patient connected to nasal cannula oxygen Cardiovascular status: stable and blood pressure returned to baseline Anesthetic complications: no Comments: Will give  500 ml NS bolus    Last Vitals:  Vitals:   03/21/16 1045 03/21/16 1156  BP: 95/63 (!) 78/53  Pulse:  (!) 54  Resp: 18 20  Temp:  36.6 C    Last Pain:  Vitals:   03/21/16 0924  TempSrc: Oral                 Philamena Kramar

## 2016-03-21 NOTE — H&P (Addendum)
Primary Care Physician:  Jacquelin Haw[facility]Shannon McElroy, PA-C Primary Gastroenterologist:  Dr. Darrick PennaFields  Pre-Procedure History & Physical: HPI:  Jody HaradaLinda Alvarez is a 60 y.o. female here for RECTAL BLEEDING/DIARRHEA/ABDOMINAL PAIN-HEP C. NEEDS SCREENING FOR VARICES.  Past Medical History:  Diagnosis Date  . Anxiety 2008   lost 4 close family members in 4 yrs, "doesn't like to leave house"  . Anxiety    [place], has been in past  . Chronic hepatitis C (HCC)   . COPD (chronic obstructive pulmonary disease) (HCC)   . GERD (gastroesophageal reflux disease)   . Hypertension     Past Surgical History:  Procedure Laterality Date  . DILATION AND CURETTAGE OF UTERUS    . RECTOCELE REPAIR    . VAGINAL HYSTERECTOMY     partial    Prior to Admission medications   Medication Sig Start Date End Date Taking? Authorizing Provider  albuterol (PROVENTIL HFA;VENTOLIN HFA) 108 (90 Base) MCG/ACT inhaler Inhale 2 puffs into the lungs every 6 (six) hours as needed for wheezing or shortness of breath. 11/02/15  Yes Jacqu[facility] McElroy, PA-C  cloNIDine (CATAPRES) 0.2 MG tablet Take 1 tablet (0.2 mg total) by mouth 2 (two) times daily. 01/31/16  Yes Laqueta LindenSuresh A Koneswaran, MD  diphenhydramine-acetaminophen (TYLENOL PM) 25-500 MG TABS tablet Take 1 tablet by mouth at bedtime as needed (sleep).   Yes Historical Provider, MD  hydrochlorothiazide (HYDRODIURIL) 25 MG tablet TAKE ONE TABLET BY MOUTH ONCE DAILY 03/13/16  Yes Jac[facility] McElroy, PA-C  ibuprofen (ADVIL,MOTRIN) 800 MG tablet Take 800 mg by mouth every 12 (twelve) hours as needed.   Yes Historical Provider, MD  lisinopril (PRINIVIL,ZESTRIL) 20 MG tablet Take 2 tablets (40 mg total) by mouth daily. 01/03/16  Yes Jacquelin [facility]annon McElroy, PA-C  loperamide (IMODIUM) 2 MG capsule Take 2 mg by mouth as needed for diarrhea or loose stools.   Yes Historical Provider, MD  mirtazapine (REMERON) 15 MG tablet Take 15 mg by mouth at bedtime.   Yes Historical  Provider, MD  pantoprazole (PROTONIX) 40 MG tablet Take 1 tablet (40 mg total) by mouth daily. 01/10/16  Yes Jacquelin HawkingShannon McElroy, PA-C  Tetrahydroz-Glyc-Hyprom-PEG (VISINE MAXIMUM REDNESS RELIEF) 0.05-0.2-0.36-1 % SOLN Apply 1 drop to eye daily as needed (for redness relief).   Yes Historical Provider, MD  metoprolol (LOPRESSOR) 100 MG tablet TAKE ONE TABLET BY MOUTH TWICE DAILY Patient not taking: Reported on 03/16/2016 02/08/16   Jacquelin HawkingShannon McElroy, PA-C    Allergies as of 03/06/2016 - Review Complete 03/06/2016  Allergen Reaction Noted  . Quetiapine Palpitations 12/31/2015  . Trazodone and nefazodone Anxiety and Palpitations 09/14/2015  . Flexeril [cyclobenzaprine] Other (See Comments) 10/04/2015  . Phenergan [promethazine hcl] Other (See Comments) 10/04/2015    Family History  Problem Relation Age of Onset  . Diabetes Mother   . Cancer Mother     uterine  . Cancer Father     unknown primary  . Cancer Sister     pancreatic  . Cancer Sister     cervical  . Cancer Sister     cervical  . Cancer Maternal Grandfather     lung  . Colon cancer Neg Hx   . Liver disease Neg Hx     Social History   Social History  . Marital status: Divorced    Spouse name: N/A  . Number of children: 1  . Years of education: N/A   Occupational History  . unemployed    Social History Main Topics  .  Smoking status: Current Every Day Smoker    Packs/day: 1.00    Years: 50.00    Types: Cigarettes    Start date: 01/30/1969  . Smokeless tobacco: Never Used     Comment: would like to try Chantix  . Alcohol use No  . 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 Sexual activity: Not Currently    Partners: Male    Birth control/ protection: Surgical   Other Topics Concern  . Not on file   Social History Narrative  . No narrative on file    Review of Systems: See HPI, otherwise negative ROS   Physical Exam: BP 95/66   Temp 97.6 F  (36.4 C) (Oral)   SpO2 97%  General:   Alert,  pleasant and cooperative in NAD Head:  Normocephalic and atraumatic. Neck:  Supple; Lungs:  Clear throughout to auscultation.    Heart:  Regular rate and rhythm. Abdomen:  Soft, nontender and nondistended. Normal bowel sounds, without guarding, and without rebound.   Neurologic:  Alert and  oriented x4;  grossly normal neurologically.  Impression/Plan:     RECTAL BLEEDING/DIARRHEA/ABDOMINAL PAIN-HEP C. NEEDS SCREENING FOR VARICES.  PLAN:  1. TCS/EGD TODAY. DISCUSSED PROCEDURE, BENEFITS, & RISKS: < 1% chance of medication reaction, bleeding, perforation, or rupture of spleen/liver.

## 2016-03-21 NOTE — Anesthesia Preprocedure Evaluation (Signed)
Anesthesia Evaluation  Patient identified by MRN, date of birth, ID band Patient awake    Reviewed: Allergy & Precautions, NPO status , Patient's Chart, lab work & pertinent test results, reviewed documented beta blocker date and time   Airway Mallampati: II  TM Distance: >3 FB     Dental  (+) Edentulous Upper, Edentulous Lower   Pulmonary COPD, Current Smoker,    breath sounds clear to auscultation       Cardiovascular hypertension, Pt. on medications and Pt. on home beta blockers  Rhythm:Regular Rate:Normal     Neuro/Psych  Headaches, PSYCHIATRIC DISORDERS Anxiety    GI/Hepatic GERD  ,(+) Hepatitis -, C  Endo/Other    Renal/GU      Musculoskeletal   Abdominal   Peds  Hematology   Anesthesia Other Findings   Reproductive/Obstetrics                             Anesthesia Physical Anesthesia Plan  ASA: III  Anesthesia Plan: MAC   Post-op Pain Management:    Induction: Intravenous  Airway Management Planned: Simple Face Mask  Additional Equipment:   Intra-op Plan:   Post-operative Plan:   Informed Consent: I have reviewed the patients History and Physical, chart, labs and discussed the procedure including the risks, benefits and alternatives for the proposed anesthesia with the patient or authorized representative who has indicated his/her understanding and acceptance.     Plan Discussed with:   Anesthesia Plan Comments:         Anesthesia Quick Evaluation

## 2016-03-21 NOTE — Op Note (Signed)
[facility] Patient Name: Jody Alvarez Procedure Date: 03/21/2016 11:36 AM MRN: [id number] Date of Birth: 1955/08/28 Attending MD: Jonette Eva , MD CSN: [id number] Age: 60 Admit Type: Outpatient Procedure:                Upper GI endoscopyWITH COLD FORCEPS BIOPSY Indications:              Epigastric abdominal pain, Nausea with vomiting Providers:                Jonette Eva, MD, Jannett Celestine, RN, Birder Robson,                            Technician Referring MD:              Medicines:                Propofol per Anesthesia Complications:            No immediate complications. Estimated Blood Loss:     Estimated blood loss was minimal. Procedure:                Pre-Anesthesia Assessment:                           - Prior to the procedure, a History and Physical                            was performed, and patient medications and                            allergies were reviewed. The patient's tolerance of                            previous anesthesia was also reviewed. The risks                            and benefits of the procedure and the sedation                            options and risks were discussed with the patient.                            All questions were answered, and informed consent                            was obtained. Prior Anticoagulants: The patient has                            taken no previous anticoagulant or antiplatelet                            agents. ASA Grade Assessment: II - A patient with                            mild systemic disease. After reviewing the risks  and benefits, the patient was deemed in                            satisfactory condition to undergo the procedure.                           After obtaining informed consent, the endoscope was                            passed under direct vision. Throughout the                            procedure, the patient's blood pressure, pulse, and                     oxygen saturations were monitored continuously. The                            [id number] ([id number]) scope was introduced through the                            mouth, and advanced to the second part of duodenum.                            The upper GI endoscopy was accomplished without                            difficulty. The patient tolerated the procedure                            well. Scope In: 11:41:12 AM Scope Out: 11:47:18 AM Total Procedure Duration: 0 hours 6 minutes 6 seconds  Findings:      The examined esophagus was normal.      Diffuse moderate inflammation characterized by congestion (edema) and       erythema was found in the gastric body, on the greater curvature of the       stomach and in the gastric antrum. Biopsies were taken with a cold       forceps for Helicobacter pylori testing.      The examined duodenum was normal. Impression:               - Normal esophagus.                           - Epigastric pain due to reflux and Gastritis.                           - Moderate Sedation:      Per Anesthesia Care Recommendation:           - High fiber diet and low fat diet.                           - Use Prilosec (omeprazole) 20 mg PO BID.                           -  Await pathology results.                           - Return to my office in 4 months.                           - Patient has a contact number available for                            emergencies. The signs and symptoms of potential                            delayed complications were discussed with the                            patient. Return to normal activities tomorrow.                            Written discharge instructions were provided to the                            patient.                           - Continue present medications. Procedure Code(s):        --- Professional ---                           [phone], Esophagogastroduodenoscopy, flexible,                             transoral; with biopsy, single or multiple Diagnosis Code(s):        --- Professional ---                           K29.70, Gastritis, unspecified, without bleeding                           R10.13, Epigastric pain                           R11.2, Nausea with vomiting, unspecified CPT copyright 2016 American Medical Association. All rights reserved. The codes documented in this report are preliminary and upon coder review may  be revised to meet current compliance requirements. Jonette Eva, MD Jonette Eva, MD 03/21/2016 12:16:17 PM This report has been signed electronically. Number of Addenda: 0

## 2016-03-21 NOTE — Op Note (Signed)
[facility] Patient Name: Jody HaradaLinda Bentley Procedure Date: 03/21/2016 10:55 AM MRN: [id number] Date of Birth: 09/11/1955 Attending MD: Jonette EvaSandi Maralyn Witherell , MD CSN: [id number] Age: 60 Admit Type: Outpatient Procedure:                Colonoscopy,DIAGNOSTIC Indications:              Hematochezia Providers:                Jonette EvaSandi Zayleigh Stroh, MD, Jannett CelestineAnitra Bell, RN, Birder Robsonebra Houghton,                            Technician Referring MD:              Medicines:                Propofol per Anesthesia Complications:            No immediate complications. Estimated Blood Loss:     Estimated blood loss: none. Procedure:                Pre-Anesthesia Assessment:                           - Prior to the procedure, a History and Physical                            was performed, and patient medications and                            allergies were reviewed. The patient's tolerance of                            previous anesthesia was also reviewed. The risks                            and benefits of the procedure and the sedation                            options and risks were discussed with the patient.                            All questions were answered, and informed consent                            was obtained. Prior Anticoagulants: The patient has                            taken no previous anticoagulant or antiplatelet                            agents. ASA Grade Assessment: II - A patient with                            mild systemic disease. After reviewing the risks                            and benefits, the  patient was deemed in                            satisfactory condition to undergo the procedure.                           After obtaining informed consent, the colonoscope                            was passed under direct vision. Throughout the                            procedure, the patient's blood pressure, pulse, and                            oxygen saturations were monitored  continuously. The                            [id number] ([id number]) scope was introduced through                            the anus and advanced to the the cecum, identified                            by appendiceal orifice and ileocecal valve. The                            ileocecal valve, appendiceal orifice, and rectum                            were photographed. The colonoscopy was somewhat                            difficult due to a tortuous colon. Successful                            completion of the procedure was aided by COLOWRAP.                            The patient tolerated the procedure well. The                            quality of the bowel preparation was good. Scope In: 11:18:58 AM Scope Out: 11:35:19 AM Scope Withdrawal Time: 0 hours 11 minutes 31 seconds  Total Procedure Duration: 0 hours 16 minutes 21 seconds  Findings:      The recto-sigmoid colon and sigmoid colon were moderately redundant. AND       COLOWRAP      The exam was otherwise without abnormality.      The digital rectal exam findings include decreased sphincter tone.      A single (solitary) three mm ulcer was found at the anus and in the       distal rectum. No bleeding was present. Impression:               -  Redundant colon.                           - The examination was otherwise normal.                           - Decreased sphincter tone found on digital rectal                            exam.                           - RECTAL BLEEDING MOST LIKELTY DUE TO SMALL                            SOLITARY RECTAL ULCER IN SETTING OPF PROBABLE                            RECTAL PROLAPSE.                           Marland Kitchen Moderate Sedation:      Per Anesthesia Care Recommendation:           - High fiber diet and low fat diet.                           - Continue present medications.                           - Repeat colonoscopy in 10 years for surveillance.                           - Return to my office  in 4 months.                           - CONSIDER SURGERY REFERRAL.                           - Patient has a contact number available for                            emergencies. The signs and symptoms of potential                            delayed complications were discussed with the                            patient. Return to normal activities tomorrow.                            Written discharge instructions were provided to the                            patient. Procedure Code(s):        --- Professional ---  52841, Colonoscopy, flexible; diagnostic, including                            collection of specimen(s) by brushing or washing,                            when performed (separate procedure) Diagnosis Code(s):        --- Professional ---                           K62.89, Other specified diseases of anus and rectum                           K92.1, Melena (includes Hematochezia)                           Q43.8, Other specified congenital malformations of                            intestine CPT copyright 2016 American Medical Association. All rights reserved. The codes documented in this report are preliminary and upon coder review may  be revised to meet current compliance requirements. Jonette Eva, MD Jonette Eva, MD 03/21/2016 12:12:21 PM This report has been signed electronically. Number of Addenda: 0

## 2016-03-24 ENCOUNTER — Encounter (HOSPITAL_COMMUNITY): Payer: Self-pay | Admitting: Gastroenterology

## 2016-03-28 ENCOUNTER — Telehealth: Payer: Self-pay

## 2016-03-28 NOTE — Telephone Encounter (Signed)
Pt called for he results from the path of her procedure. I looked it up and informed her and told her I will see if Dr. Darrick PennaFields has any recommendations.

## 2016-03-29 ENCOUNTER — Other Ambulatory Visit: Payer: Self-pay

## 2016-03-29 NOTE — Discharge Instructions (Signed)
YOUR UPPER ABDOMINAL PAIN IS DUE TO reflux and GASTRITIS.  You DO NOT HAVE HEMORRHOIDS. YOUR RECTAL BLEEDING IS MOST LIKELY DUE TO MILD RECTAL PROLAPSE. I BIOPSIED YOUR STOMACH.      FOLLOW A LOW FAT/HIGH FIBER DIET. AVOID ITEMS THAT CAUSE BLOATING. SEE INFO BELOW.  CONTINUE OMEPRAZOLE.  TAKE 30 MINUTES PRIOR TO YOUR MEALS TWICE DAILY.  AVOID ITEMS THAT TRIGGER GASTRITIS. SEE INFO BELOW  YOUR BIOPSY RESULTS WILL BE AVAILABLE IN MY CHART AFTER OCT 5 AND MY OFFICE WILL CONTACT YOU IN 10-14 DAYS WITH YOUR RESULTS.   FOLLOW UP IN 3 MOS.   ENDOSCOPY Care After Read the instructions outlined below and refer to this sheet in the next week. These discharge instructions provide you with general information on caring for yourself after you leave the hospital. While your treatment has been planned according to the most current medical practices available, unavoidable complications occasionally occur. If you have any problems or questions after discharge, call DR. Rael Yo, [phone].  ACTIVITY  You may resume your regular activity, but move at a slower pace for the next 24 hours.   Take frequent rest periods for the next 24 hours.   Walking will help get rid of the air and reduce the bloated feeling in your belly (abdomen).   No driving for 24 hours (because of the medicine (anesthesia) used during the test).   You may shower.   Do not sign any important legal documents or operate any machinery for 24 hours (because of the anesthesia used during the test).    NUTRITION  Drink plenty of fluids.   You may resume your normal diet as instructed by your doctor.   Begin with a light meal and progress to your normal diet. Heavy or fried foods are harder to digest and may make you feel sick to your stomach (nauseated).   Avoid alcoholic beverages for 24 hours or as instructed.    MEDICATIONS  You may resume your normal medications.   WHAT YOU CAN EXPECT TODAY  Some feelings of  bloating in the abdomen.   Passage of more gas than usual.   Spotting of blood in your stool or on the toilet paper  .  IF YOU HAD POLYPS REMOVED DURING THE ENDOSCOPY:  Eat a soft diet IF YOU HAVE NAUSEA, BLOATING, ABDOMINAL PAIN, OR VOMITING.    FINDING OUT THE RESULTS OF YOUR TEST Not all test results are available during your visit. DR. Darrick Penna WILL CALL YOU WITHIN 7 DAYS OF YOUR PROCEDUE WITH YOUR RESULTS. Do not assume everything is normal if you have not heard from DR. Lorraine Cimmino IN ONE WEEK, CALL HER OFFICE AT [phone].  SEEK IMMEDIATE MEDICAL ATTENTION AND CALL THE OFFICE: [phone] IF:  You have more than a spotting of blood in your stool.   Your belly is swollen (abdominal distention).   You are nauseated or vomiting.   You have a temperature over 101F.   You have abdominal pain or discomfort that is severe or gets worse throughout the day.   Gastritis  Gastritis is an inflammation (the body's way of reacting to injury and/or infection) of the stomach. It is often caused by bacterial (germ) infections. It can also be caused BY ASPIRIN, BC/GOODY POWDER'S, (IBUPROFEN) MOTRIN, OR ALEVE (NAPROXEN), chemicals (including alcohol), SPICY FOODS, and medications. This illness may be associated with generalized malaise (feeling tired, not well), UPPER ABDOMINAL STOMACH cramps, and fever. One common bacterial cause of gastritis is an organism known as H.  Pylori. This can be treated with antibiotics.    High-Fiber Diet A high-fiber diet changes your normal diet to include more whole grains, legumes, fruits, and vegetables. Changes in the diet involve replacing refined carbohydrates with unrefined foods. The calorie level of the diet is essentially unchanged. The Dietary Reference Intake (recommended amount) for adult males is 38 grams per day. For adult females, it is 25 grams per day. Pregnant and lactating women should consume 28 grams of fiber per day. Fiber is the intact part  of a plant that is not broken down during digestion. Functional fiber is fiber that has been isolated from the plant to provide a beneficial effect in the body. PURPOSE  Increase stool bulk.   Ease and regulate bowel movements.   Lower cholesterol.  INDICATIONS THAT YOU NEED MORE FIBER  Constipation and hemorrhoids.   Uncomplicated diverticulosis (intestine condition) and irritable bowel syndrome.   Weight management.   As a protective measure against hardening of the arteries (atherosclerosis), diabetes, and cancer.   DO NOT USE WITH:  Acute diverticulitis (intestine infection).   Partial small bowel obstructions.   Complicated diverticular disease involving bleeding, rupture (perforation), or abscess (boil, furuncle).   Presence of autonomic neuropathy (nerve damage) or gastroparesis (stomach cannot empty itself).    GUIDELINES FOR INCREASING FIBER IN THE DIET  Start adding fiber to the diet slowly. A gradual increase of about 5 more grams (2 slices of whole-wheat bread, 2 servings of most fruits or vegetables, or 1 bowl of high-fiber cereal) per day is best. Too rapid an increase in fiber may result in constipation, flatulence, and bloating.   Drink enough water and fluids to keep your urine clear or pale yellow. Water, juice, or caffeine-free drinks are recommended. Not drinking enough fluid may cause constipation.   Eat a variety of high-fiber foods rather than one type of fiber.   Try to increase your intake of fiber through using high-fiber foods rather than fiber pills or supplements that contain small amounts of fiber.   The goal is to change the types of food eaten. Do not supplement your present diet with high-fiber foods, but replace foods in your present diet.    INCLUDE A VARIETY OF FIBER SOURCES  Replace refined and processed grains with whole grains, canned fruits with fresh fruits, and incorporate other fiber sources. White rice, white breads, and most  bakery goods contain little or no fiber.   Brown whole-grain rice, buckwheat oats, and many fruits and vegetables are all good sources of fiber. These include: broccoli, Brussels sprouts, cabbage, cauliflower, beets, sweet potatoes, white potatoes (skin on), carrots, tomatoes, eggplant, squash, berries, fresh fruits, and dried fruits.   Cereals appear to be the richest source of fiber. Cereal fiber is found in whole grains and bran. Bran is the fiber-rich outer coat of cereal grain, which is largely removed in refining. In whole-grain cereals, the bran remains. In breakfast cereals, the largest amount of fiber is found in those with "bran" in their names. The fiber content is sometimes indicated on the label.   You may need to include additional fruits and vegetables each day.   In baking, for 1 cup white flour, you may use the following substitutions:   1 cup whole-wheat flour minus 2 tablespoons.   1/2 cup white flour plus 1/2 cup whole-wheat flour.   Low-Fat Diet BREADS, CEREALS, PASTA, RICE, DRIED PEAS, AND BEANS These products are high in carbohydrates and most are low in fat. Therefore, they  can be increased in the diet as substitutes for fatty foods. They too, however, contain calories and should not be eaten in excess. Cereals can be eaten for snacks as well as for breakfast.  Include foods that contain fiber (fruits, vegetables, whole grains, and legumes). Research shows that fiber may lower blood cholesterol levels, especially the water-soluble fiber found in fruits, vegetables, oat products, and legumes. FRUITS AND VEGETABLES It is good to eat fruits and vegetables. Besides being sources of fiber, both are rich in vitamins and some minerals. They help you get the daily allowances of these nutrients. Fruits and vegetables can be used for snacks and desserts. MEATS Limit lean meat, chicken, [place], and fish to no more than 6 ounces per day. Beef, Pork, and Lamb Use lean cuts of beef,  pork, and lamb. Lean cuts include:  Extra-lean ground beef.  Arm roast.  Sirloin tip.  Center-cut ham.  Round steak.  Loin chops.  Rump roast.  Tenderloin.  Trim all fat off the outside of meats before cooking. It is not necessary to severely decrease the intake of red meat, but lean choices should be made. Lean meat is rich in protein and contains a highly absorbable form of iron. Premenopausal women, in particular, should avoid reducing lean red meat because this could increase the risk for low red blood cells (iron-deficiency anemia). The organ meats, such as liver, sweetbreads, kidneys, and brain are very rich in cholesterol. They should be limited. Chicken and [place] These are good sources of protein. The fat of poultry can be reduced by removing the skin and underlying fat layers before cooking. Chicken and [place] can be substituted for lean red meat in the diet. Poultry should not be fried or covered with high-fat sauces. Fish and Shellfish Fish is a good source of protein. Shellfish contain cholesterol, but they usually are low in saturated fatty acids. The preparation of fish is important. Like chicken and [place], they should not be fried or covered with high-fat sauces. EGGS Egg whites contain no fat or cholesterol. They can be eaten often. Try 1 to 2 egg whites instead of whole eggs in recipes or use egg substitutes that do not contain yolk. MILK AND DAIRY PRODUCTS Use skim or 1% milk instead of 2% or whole milk. Decrease whole milk, natural, and processed cheeses. Use nonfat or low-fat (2%) cottage cheese or low-fat cheeses made from vegetable oils. Choose nonfat or low-fat (1 to 2%) yogurt. Experiment with evaporated skim milk in recipes that call for heavy cream. Substitute low-fat yogurt or low-fat cottage cheese for sour cream in dips and salad dressings. Have at least 2 servings of low-fat dairy products, such as 2 glasses of skim (or 1%) milk each day to help get your daily  calcium intake.  FATS AND OILS Reduce the total intake of fats, especially saturated fat. Butterfat, lard, and beef fats are high in saturated fat and cholesterol. These should be avoided as much as possible. Vegetable fats do not contain cholesterol, but certain vegetable fats, such as coconut oil, palm oil, and palm kernel oil are very high in saturated fats. These should be limited. These fats are often used in bakery goods, processed foods, popcorn, oils, and nondairy creamers. Vegetable shortenings and some peanut butters contain hydrogenated oils, which are also saturated fats. Read the labels on these foods and check for saturated vegetable oils. Unsaturated vegetable oils and fats do not raise blood cholesterol. However, they should be limited because they are fats and  are high in calories. Total fat should still be limited to 30% of your daily caloric intake. Desirable liquid vegetable oils are corn oil, cottonseed oil, olive oil, canola oil, safflower oil, soybean oil, and sunflower oil. Peanut oil is not as good, but small amounts are acceptable. Buy a heart-healthy tub margarine that has no partially hydrogenated oils in the ingredients. Mayonnaise and salad dressings often are made from unsaturated fats, but they should also be limited because of their high calorie and fat content. Seeds, nuts, peanut butter, olives, and avocados are high in fat, but the fat is mainly the unsaturated type. These foods should be limited mainly to avoid excess calories and fat. OTHER EATING TIPS Snacks  Most sweets should be limited as snacks. They tend to be rich in calories and fats, and their caloric content outweighs their nutritional value. Some good choices in snacks are graham crackers, melba toast, soda crackers, bagels (no egg), English muffins, fruits, and vegetables. These snacks are preferable to snack crackers, [place] fries, and chips. Popcorn should be air-popped or cooked in small amounts of liquid  vegetable oil. Desserts Eat fruit, low-fat yogurt, and fruit ices. AVOID pastries, cake, and cookies. Sherbet, angel food cake, gelatin dessert, frozen low-fat yogurt, or other frozen products that do not contain saturated fat (pure fruit juice bars, frozen ice pops) are also acceptable.  COOKING METHODS Choose those methods that use little or no fat. They include: Poaching.  Braising.  Steaming.  Grilling.  Baking.  Stir-frying.  Broiling.  Microwaving.  Foods can be cooked in a nonstick pan without added fat, or use a nonfat cooking spray in regular cookware. Limit fried foods and avoid frying in saturated fat. Add moisture to lean meats by using water, broth, cooking wines, and other nonfat or low-fat sauces along with the cooking methods mentioned above. Soups and stews should be chilled after cooking. The fat that forms on top after a few hours in the refrigerator should be skimmed off. When preparing meals, avoid using excess salt. Salt can contribute to raising blood pressure in some people. EATING AWAY FROM HOME Order entres, potatoes, and vegetables without sauces or butter. When meat exceeds the size of a deck of cards (3 to 4 ounces), the rest can be taken home for another meal. Choose vegetable or fruit salads and ask for low-calorie salad dressings to be served on the side. Use dressings sparingly. Limit high-fat toppings, such as bacon, crumbled eggs, cheese, sunflower seeds, and olives. Ask for heart-healthy tub margarine instead of butter.  Hemorrhoids Hemorrhoids are dilated (enlarged) veins around the rectum. Sometimes clots will form in the veins. This makes them swollen and painful. These are called thrombosed hemorrhoids. Causes of hemorrhoids include:  Constipation.   Straining to have a bowel movement.   HEAVY LIFTING HOME CARE INSTRUCTIONS  Eat a well balanced diet and drink 6 to 8 glasses of water every day to avoid constipation. You may also use a bulk  laxative.   Avoid straining to have bowel movements.   Keep anal area dry and clean.   Do not use a donut shaped pillow or sit on the toilet for long periods. This increases blood pooling and pain.   Move your bowels when your body has the urge; this will require less straining and will decrease pain and pressure. =

## 2016-03-29 NOTE — Telephone Encounter (Signed)
Please call pt. HER stomach Bx shows gastritis. HER UPPER ABDOMINAL PAIN IS DUE TO reflux and GASTRITIS.  HER RECTAL BLEEDING IS MOST LIKELY DUE TO MILD RECTAL PROLAPSE.    SEE GYN UROLOGY TO TO EVALUATE FOR A RECTOCELE WITHIN THE NEXT 2-4 WEEKS, DX: RECTAL BLEEDING/ANAL ULCER/PUSSIBLE RECTOCELE.  FOLLOW A LOW FAT/HIGH FIBER DIET. AVOID ITEMS THAT CAUSE BLOATING.   CONTINUE OMEPRAZOLE.  TAKE 30 MINUTES PRIOR TO YOUR MEALS TWICE DAILY.  AVOID ITEMS THAT TRIGGER GASTRITIS.   FOLLOW UP IN 3 MOS DX: ABDOMINAL PAIN, RECTAL BLEEDING.

## 2016-03-29 NOTE — Telephone Encounter (Signed)
OV made °

## 2016-03-30 ENCOUNTER — Other Ambulatory Visit: Payer: Self-pay

## 2016-03-30 DIAGNOSIS — K626 Ulcer of anus and rectum: Secondary | ICD-10-CM

## 2016-03-30 DIAGNOSIS — K625 Hemorrhage of anus and rectum: Secondary | ICD-10-CM

## 2016-03-30 DIAGNOSIS — N816 Rectocele: Secondary | ICD-10-CM

## 2016-03-30 NOTE — Telephone Encounter (Signed)
Pt is aware.  

## 2016-03-30 NOTE — Telephone Encounter (Signed)
Referral sent to OB/GYN via Epic.

## 2016-04-03 ENCOUNTER — Ambulatory Visit: Payer: Self-pay | Admitting: Physician Assistant

## 2016-04-03 ENCOUNTER — Encounter: Payer: Self-pay | Admitting: Physician Assistant

## 2016-04-03 VITALS — BP 150/92 | HR 63 | Temp 97.7°F | Ht 66.0 in | Wt 149.8 lb

## 2016-04-03 DIAGNOSIS — F17219 Nicotine dependence, cigarettes, with unspecified nicotine-induced disorders: Secondary | ICD-10-CM

## 2016-04-03 DIAGNOSIS — I10 Essential (primary) hypertension: Secondary | ICD-10-CM

## 2016-04-03 DIAGNOSIS — K2970 Gastritis, unspecified, without bleeding: Secondary | ICD-10-CM

## 2016-04-03 MED ORDER — IBUPROFEN 800 MG PO TABS: 800.0000 mg | ORAL_TABLET | Freq: Two times a day (BID) | ORAL | 1 refills | Status: DC | PRN

## 2016-04-03 MED ORDER — METOPROLOL TARTRATE 50 MG PO TABS: 50.0000 mg | ORAL_TABLET | Freq: Two times a day (BID) | ORAL | 1 refills | Status: AC

## 2016-04-03 MED ORDER — OMEPRAZOLE 20 MG PO CPDR: DELAYED_RELEASE_CAPSULE | ORAL | 1 refills | Status: AC

## 2016-04-03 NOTE — Progress Notes (Signed)
BP (!) 150/92   Pulse 63   Temp 97.7 F (36.5 C)   Ht 5\' 6"  (1.676 m)   Wt 149 lb 12 oz (67.9 kg)   SpO2 99%   BMI 24.17 kg/m    Subjective:    Patient ID: Jody Alvarez, female    DOB: 01/10/1956, 60 y.o.   MRN: [id number]  HPI: Jody Alvarez is a 60 y.o. female presenting on 04/03/2016 for Hypertension   HPI Pt says she turned in her [employer] discount application.  She got a letter saying she got 75% discount.    She has seen GI and they referred her to gyn for rectocele.   She has been seen by cardiology who do not need to see her again. Pt is awaiting appointment with neurology.   Pt says she is "falling out" from her bp pills.  Pt has been taking the metoprolol "sometimes" based on her bp readings.  Pt not taking her ppi.  GI changed her to omeprazole which she couldn't afford.    She says the ppi stopped her coughing and her diarrhea.    She is still going to [facility] for [facility] issues.   Relevant past medical, surgical, family and social history reviewed and updated as indicated. Interim medical history since our last visit reviewed. Allergies and medications reviewed and updated.   Current Outpatient Prescriptions:  .  albuterol (PROVENTIL HFA;VENTOLIN HFA) 108 (90 Base) MCG/ACT inhaler, Inhale 2 puffs into the lungs every 6 (six) hours as needed for wheezing or shortness of breath., Disp: 3 Inhaler, Rfl: 0 .  cloNIDine (CATAPRES) 0.2 MG tablet, Take 1 tablet (0.2 mg total) by mouth 2 (two) times daily., Disp: 60 tablet, Rfl: 6 .  diphenhydramine-acetaminophen (TYLENOL PM) 25-500 MG TABS tablet, Take 1 tablet by mouth at bedtime as needed (sleep)., Disp: , Rfl:  .  hydrochlorothiazide (HYDRODIURIL) 25 MG tablet, TAKE ONE TABLET BY MOUTH ONCE DAILY, Disp: 30 tablet, Rfl: 1 .  ibuprofen (ADVIL,MOTRIN) 800 MG tablet, Take 800 mg by mouth every 12 (twelve) hours as needed., Disp: , Rfl:  .  lisinopril (PRINIVIL,ZESTRIL) 20 MG tablet, Take 2 tablets (40 mg total) by mouth daily.,  Disp: 60 tablet, Rfl: 1 .  metoprolol (LOPRESSOR) 100 MG tablet, Take 100 mg by mouth 2 (two) times daily., Disp: , Rfl:  .  Tetrahydroz-Glyc-Hyprom-PEG (VISINE MAXIMUM REDNESS RELIEF) 0.05-0.2-0.36-1 % SOLN, Apply 1 drop to eye daily as needed (for redness relief)., Disp: , Rfl:  .  mirtazapine (REMERON) 15 MG tablet, Take 15 mg by mouth at bedtime., Disp: , Rfl:  .  omeprazole (PRILOSEC) 20 MG capsule, 1 PO 30 mins prior to breakfast and supper (Patient not taking: Reported on 04/03/2016), Disp: 60 capsule, Rfl: 11  Review of Systems  Constitutional: Positive for diaphoresis and fatigue. Negative for appetite change, chills, fever and unexpected weight change.  HENT: Positive for dental problem. Negative for congestion, drooling, ear pain, facial swelling, hearing loss, mouth sores, sneezing, sore throat, trouble swallowing and voice change.   Eyes: Positive for redness. Negative for pain, discharge, itching and visual disturbance.  Respiratory: Positive for cough, shortness of breath and wheezing. Negative for choking.   Cardiovascular: Negative for chest pain, palpitations and leg swelling.  Gastrointestinal: Positive for abdominal pain. Negative for blood in stool, constipation, diarrhea and vomiting.  Endocrine: Negative for cold intolerance, heat intolerance and polydipsia.  Genitourinary: Negative for decreased urine volume, dysuria and hematuria.  Musculoskeletal: Positive for arthralgias and back pain.  Negative for gait problem.  Skin: Negative for rash.  Allergic/Immunologic: Negative for environmental allergies.  Neurological: Positive for syncope and light-headedness. Negative for seizures and headaches.  Hematological: Negative for adenopathy.  Psychiatric/Behavioral: Positive for agitation and dysphoric mood. Negative for suicidal ideas. The patient is nervous/anxious.     Per HPI unless specifically indicated above     Objective:    BP (!) 150/92   Pulse 63   Temp 97.7  F (36.5 C)   Ht 5\' 6"  (1.676 m)   Wt 149 lb 12 oz (67.9 kg)   SpO2 99%   BMI 24.17 kg/m   Wt Readings from Last 3 Encounters:  04/03/16 149 lb 12 oz (67.9 kg)  03/17/16 149 lb (67.6 kg)  03/10/16 147 lb (66.7 kg)    Physical Exam  Constitutional: She is oriented to person, place, and time. She appears well-developed and well-nourished.  HENT:  Head: Normocephalic and atraumatic.  Neck: Neck supple.  Cardiovascular: Normal rate and regular rhythm.   Pulmonary/Chest: Effort normal and breath sounds normal.  Abdominal: Soft. Bowel sounds are normal. She exhibits no mass. There is no hepatosplenomegaly. There is generalized tenderness (mild). There is no rigidity, no rebound and no guarding.  Musculoskeletal: She exhibits no edema.  Lymphadenopathy:    She has no cervical adenopathy.  Neurological: She is alert and oriented to person, place, and time.  Skin: Skin is warm and dry.  Psychiatric: She has a normal mood and affect. Her behavior is normal.  Vitals reviewed.       Assessment & Plan:   Encounter Diagnoses  Name Primary?  . Essential hypertension, benign Yes  . Cigarette nicotine dependence with nicotine-induced disorder   . Gastritis, presence of bleeding unspecified, unspecified chronicity, unspecified gastritis type      -rx for omeprazole sent to [facility] -Change metoprolol to 50mg  bid so we can hopefully get pt on regimen that she can take without having to monitor her bp -Counseled pt to use caution with ibu due to gastritis -Awaiting gyn appt for rectocele -F/u 1 month to check bp on metoprolol 50mg  bid. RTO sooner prn

## 2016-04-24 ENCOUNTER — Encounter: Payer: Self-pay | Admitting: Obstetrics & Gynecology

## 2016-05-01 ENCOUNTER — Ambulatory Visit (INDEPENDENT_AMBULATORY_CARE_PROVIDER_SITE_OTHER): Payer: Self-pay | Admitting: Obstetrics & Gynecology

## 2016-05-01 ENCOUNTER — Encounter: Payer: Self-pay | Admitting: Obstetrics & Gynecology

## 2016-05-01 VITALS — BP 110/90 | HR 78 | Wt 148.0 lb

## 2016-05-01 DIAGNOSIS — N952 Postmenopausal atrophic vaginitis: Secondary | ICD-10-CM

## 2016-05-01 DIAGNOSIS — K625 Hemorrhage of anus and rectum: Secondary | ICD-10-CM

## 2016-05-01 NOTE — Progress Notes (Signed)
Chief Complaint  Patient presents with  . new gyn    c/o rectal bleeding    Blood pressure 110/90, pulse 78, weight 148 lb (67.1 kg).  60 y.o. No obstetric history on file. No LMP recorded. Patient is postmenopausal. The current method of family planning is status post hysterectomy.  Outpatient Encounter Prescriptions as of 05/01/2016  Medication Sig  . albuterol (PROVENTIL HFA;VENTOLIN HFA) 108 (90 Base) MCG/ACT inhaler Inhale 2 puffs into the lungs every 6 (six) hours as needed for wheezing or shortness of breath.  . cloNIDine (CATAPRES) 0.2 MG tablet Take 1 tablet (0.2 mg total) by mouth 2 (two) times daily.  . hydrochlorothiazide (HYDRODIURIL) 25 MG tablet TAKE ONE TABLET BY MOUTH ONCE DAILY  . lisinopril (PRINIVIL,ZESTRIL) 20 MG tablet Take 2 tablets (40 mg total) by mouth daily.  . metoprolol (LOPRESSOR) 50 MG tablet Take 1 tablet (50 mg total) by mouth 2 (two) times daily.  Marland Kitchen. omeprazole (PRILOSEC) 20 MG capsule 1 PO 30 mins prior to breakfast and supper  . Tetrahydroz-Glyc-Hyprom-PEG (VISINE MAXIMUM REDNESS RELIEF) 0.05-0.2-0.36-1 % SOLN Apply 1 drop to eye daily as needed (for redness relief).  . [DISCONTINUED] diphenhydramine-acetaminophen (TYLENOL PM) 25-500 MG TABS tablet Take 1 tablet by mouth at bedtime as needed (sleep).  . [DISCONTINUED] ibuprofen (ADVIL,MOTRIN) 800 MG tablet Take 1 tablet (800 mg total) by mouth every 12 (twelve) hours as needed.  . [DISCONTINUED] mirtazapine (REMERON) 15 MG tablet Take 15 mg by mouth at bedtime.   No facility-administered encounter medications on file as of 05/01/2016.     Subjective Pt with history of rectocoel repair Recently had a colonoscopy for rectal bleeding which was negative Asked to see me for evaluation of rectocoele Pt does have SUI symtpoms for 10 years  Objective Severe atrophy of the vagina No significant rectocoele or cystocoel  Pertinent ROS No burning with urination, frequency or urgency No nausea,  vomiting or diarrhea Nor fever chills or other constitutional symptoms   Labs or studies     Impression Diagnoses this Encounter::   ICD-9-CM ICD-10-CM   1. Vaginal atrophy 627.3 N95.2     Established relevant diagnosis(es):   Plan/Recommendations: No orders of the defined types were placed in this encounter.   Labs or Scans Ordered: No orders of the defined types were placed in this encounter.   Management:: Premarin cream vaginally 1 gram every other night Follow up exam in 3-6 months  Follow up Return in about 6 months (around 10/29/2016) for Follow up, with Dr Despina HiddenEure.         All questions were answered.  Past Medical History:  Diagnosis Date  . Anxiety 2008   lost 4 close family members in 4 yrs, "doesn't like to leave house"  . Anxiety    [facility], has been in past  . Chronic hepatitis C (HCC)   . COPD (chronic obstructive pulmonary disease) (HCC)   . GERD (gastroesophageal reflux disease)   . Hypertension     Past Surgical History:  Procedure Laterality Date  . BIOPSY  03/21/2016   Procedure: BIOPSY;  Surgeon: West BaliSandi L Fields, MD;  Location: [facility];  Service: Endoscopy;;  gastric   . COLONOSCOPY WITH PROPOFOL N/A 03/21/2016   Procedure: COLONOSCOPY WITH PROPOFOL;  Surgeon: West BaliSandi L Fields, MD;  Location: [facility];  Service: Endoscopy;  Laterality: N/A;  115  . DILATION AND CURETTAGE OF UTERUS    . ESOPHAGOGASTRODUODENOSCOPY (EGD)  WITH PROPOFOL N/A 03/21/2016   Procedure: ESOPHAGOGASTRODUODENOSCOPY (EGD) WITH PROPOFOL;  Surgeon: West BaliSandi L Fields, MD;  Location: [facility];  Service: Endoscopy;  Laterality: N/A;  . RECTOCELE REPAIR    . VAGINAL HYSTERECTOMY     partial    OB History    No data available      Allergies  Allergen Reactions  . Quetiapine Palpitations  . Trazodone And Nefazodone Anxiety and Palpitations  . Flexeril [Cyclobenzaprine] Other (See Comments)    Hallucinations   .  Phenergan [Promethazine Hcl] Other (See Comments)    Hallucinations    Social History   Social History  . Marital status: Divorced    Spouse name: N/A  . Number of children: 1  . Years of education: N/A   Occupational History  . unemployed    Social History Main Topics  . Smoking status: Current Every Day Smoker    Packs/day: 1.00    Years: 50.00    Types: Cigarettes    Start date: 01/30/1969  . Smokeless tobacco: Never Used     Comment: would like to try Chantix  . Alcohol use No  . 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 Sexual activity: Not Currently    Partners: Male    Birth control/ protection: Surgical   Other Topics Concern  . None   Social History Narrative  . None    Family History  Problem Relation Age of Onset  . Diabetes Mother   . Cancer Mother     uterine  . Cancer Father     unknown primary  . Cancer Sister     pancreatic  . Cancer Sister     cervical  . Cancer Sister     cervical  . Cancer Maternal Grandfather     lung  . Colon cancer Neg Hx   . Liver disease Neg Hx

## 2016-05-08 ENCOUNTER — Ambulatory Visit: Payer: Self-pay | Admitting: Physician Assistant

## 2016-05-08 ENCOUNTER — Encounter: Payer: Self-pay | Admitting: Physician Assistant

## 2016-05-08 VITALS — BP 180/100 | HR 54 | Temp 98.8°F | Ht 66.0 in | Wt 153.2 lb

## 2016-05-08 DIAGNOSIS — R1084 Generalized abdominal pain: Secondary | ICD-10-CM

## 2016-05-08 DIAGNOSIS — B182 Chronic viral hepatitis C: Secondary | ICD-10-CM

## 2016-05-08 DIAGNOSIS — I10 Essential (primary) hypertension: Secondary | ICD-10-CM

## 2016-05-08 DIAGNOSIS — K2970 Gastritis, unspecified, without bleeding: Secondary | ICD-10-CM

## 2016-05-08 DIAGNOSIS — R55 Syncope and collapse: Secondary | ICD-10-CM

## 2016-05-08 DIAGNOSIS — R51 Headache: Secondary | ICD-10-CM

## 2016-05-08 DIAGNOSIS — R519 Headache, unspecified: Secondary | ICD-10-CM

## 2016-05-08 DIAGNOSIS — N189 Chronic kidney disease, unspecified: Secondary | ICD-10-CM

## 2016-05-08 DIAGNOSIS — K219 Gastro-esophageal reflux disease without esophagitis: Secondary | ICD-10-CM

## 2016-05-08 DIAGNOSIS — F17219 Nicotine dependence, cigarettes, with unspecified nicotine-induced disorders: Secondary | ICD-10-CM

## 2016-05-08 NOTE — Progress Notes (Signed)
BP (!) 180/100   Pulse (!) 54   Temp 98.8 F (37.1 C)   Ht 5\' 6"  (1.676 m)   Wt 153 lb 4 oz (69.5 kg)   SpO2 98%   BMI 24.74 kg/m    Subjective:    Patient ID: Jody Alvarez, female    DOB: 02/26/1956, 60 y.o.   MRN: [id number]  HPI: Jody Alvarez is a 60 y.o. female presenting on 05/08/2016 for Hypertension   HPI   Asked pt about no-show with neuro.  She says the office told her not to come until she had the [employer] discount  She is still having LOC episodes.  She says she episode "the other day", not sure when but it was since her last OV here on 04/03/16  bp is very high today.  It was great last week at the gyn office.   She is still checking her bp at home before she takes her meds.  If it is really low, she says she doesn't take her med.  She says that doesn't happen very often.  Today she is feeling well and specifically denies HA, CP, vision changes  Pt continues with GI for abdominal pain and hepatitis.  Relevant past medical, surgical, family and social history reviewed and updated as indicated. Interim medical history since our last visit reviewed. Allergies and medications reviewed and updated.   Current Outpatient Prescriptions:  .  albuterol (PROVENTIL HFA;VENTOLIN HFA) 108 (90 Base) MCG/ACT inhaler, Inhale 2 puffs into the lungs every 6 (six) hours as needed for wheezing or shortness of breath., Disp: 3 Inhaler, Rfl: 0 .  Amitriptyline HCl (ELAVIL PO), Take 1 tablet by mouth at bedtime as needed., Disp: , Rfl:  .  cloNIDine (CATAPRES) 0.2 MG tablet, Take 1 tablet (0.2 mg total) by mouth 2 (two) times daily., Disp: 60 tablet, Rfl: 6 .  hydrochlorothiazide (HYDRODIURIL) 25 MG tablet, TAKE ONE TABLET BY MOUTH ONCE DAILY, Disp: 30 tablet, Rfl: 1 .  lisinopril (PRINIVIL,ZESTRIL) 20 MG tablet, Take 2 tablets (40 mg total) by mouth daily., Disp: 60 tablet, Rfl: 1 .  metoprolol (LOPRESSOR) 50 MG tablet, Take 1 tablet (50 mg total) by mouth 2 (two) times daily., Disp: 60  tablet, Rfl: 1 .  omeprazole (PRILOSEC) 20 MG capsule, 1 PO 30 mins prior to breakfast and supper, Disp: 180 capsule, Rfl: 1 .  Tetrahydroz-Glyc-Hyprom-PEG (VISINE MAXIMUM REDNESS RELIEF) 0.05-0.2-0.36-1 % SOLN, Apply 1 drop to eye daily as needed (for redness relief)., Disp: , Rfl:    Review of Systems  Constitutional: Positive for fatigue. Negative for appetite change, chills, diaphoresis, fever and unexpected weight change.  HENT: Positive for dental problem. Negative for congestion, drooling, ear pain, facial swelling, hearing loss, mouth sores, sneezing, sore throat, trouble swallowing and voice change.   Eyes: Positive for redness. Negative for pain, discharge, itching and visual disturbance.  Respiratory: Positive for cough, shortness of breath and wheezing. Negative for choking.   Cardiovascular: Negative for chest pain, palpitations and leg swelling.  Gastrointestinal: Positive for abdominal pain. Negative for blood in stool, constipation, diarrhea and vomiting.  Endocrine: Negative for cold intolerance, heat intolerance and polydipsia.  Genitourinary: Negative for decreased urine volume, dysuria and hematuria.  Musculoskeletal: Positive for back pain. Negative for arthralgias and gait problem.  Skin: Negative for rash.  Allergic/Immunologic: Negative for environmental allergies.  Neurological: Positive for syncope, light-headedness and headaches. Negative for seizures.  Hematological: Negative for adenopathy.  Psychiatric/Behavioral: Positive for agitation and dysphoric mood. Negative for suicidal  ideas. The patient is nervous/anxious.     Per HPI unless specifically indicated above     Objective:    BP (!) 180/100   Pulse (!) 54   Temp 98.8 F (37.1 C)   Ht 5\' 6"  (1.676 m)   Wt 153 lb 4 oz (69.5 kg)   SpO2 98%   BMI 24.74 kg/m   Wt Readings from Last 3 Encounters:  05/08/16 153 lb 4 oz (69.5 kg)  05/01/16 148 lb (67.1 kg)  04/03/16 149 lb 12 oz (67.9 kg)     Physical Exam  Constitutional: She is oriented to person, place, and time. She appears well-developed and well-nourished.  HENT:  Head: Normocephalic and atraumatic.  Neck: Neck supple.  Cardiovascular: Normal rate and regular rhythm.   Pulmonary/Chest: Effort normal and breath sounds normal.  Abdominal: Soft. Bowel sounds are normal. She exhibits no mass. There is no hepatosplenomegaly. There is no tenderness.  Musculoskeletal: She exhibits no edema.  Lymphadenopathy:    She has no cervical adenopathy.  Neurological: She is alert and oriented to person, place, and time.  Skin: Skin is warm and dry.  Psychiatric: She has a normal mood and affect. Her behavior is normal.  Vitals reviewed.        Assessment & Plan:    Encounter Diagnoses  Name Primary?  . Essential hypertension, benign Yes  . Syncope, unspecified syncope type   . Cigarette nicotine dependence with nicotine-induced disorder   . Chronic hepatitis C without hepatic coma (HCC)   . Gastritis, presence of bleeding unspecified, unspecified chronicity, unspecified gastritis type   . Gastroesophageal reflux disease, esophagitis presence not specified   . Generalized abdominal pain   . Headache, unspecified headache type   . Chronic kidney disease, unspecified CKD stage     -check bmp -Will check on neurology referral (for syncope) -F/u 1 month.

## 2016-05-08 NOTE — Patient Instructions (Signed)
Get blood/labs drawn

## 2016-05-10 ENCOUNTER — Encounter: Payer: Self-pay | Admitting: Obstetrics and Gynecology

## 2016-05-18 ENCOUNTER — Telehealth: Payer: Self-pay | Admitting: Student

## 2016-05-18 NOTE — Telephone Encounter (Signed)
Called and advised pt to reschedule her appointment with neuro. Pt was given their office's phone number. Pt states she is now approved for the [employer] discount. Pt was advised to take her approval letter with her to her appointment with neuro. Pt verbalized understanding.

## 2016-05-18 NOTE — Telephone Encounter (Signed)
-----   Message from Jacquelin HawkingShannon McElroy, [place] sent at 05/08/2016  9:46 PM EST ----- Please check on neuro referral. thanks

## 2016-05-26 ENCOUNTER — Other Ambulatory Visit: Payer: Self-pay | Admitting: Physician Assistant

## 2016-05-29 ENCOUNTER — Ambulatory Visit: Payer: Self-pay | Admitting: Physician Assistant

## 2016-05-29 ENCOUNTER — Encounter: Payer: Self-pay | Admitting: Physician Assistant

## 2016-05-29 VITALS — BP 120/82 | HR 54 | Temp 97.5°F | Ht 66.0 in | Wt 147.6 lb

## 2016-05-29 DIAGNOSIS — I10 Essential (primary) hypertension: Secondary | ICD-10-CM

## 2016-05-29 DIAGNOSIS — N189 Chronic kidney disease, unspecified: Secondary | ICD-10-CM

## 2016-05-29 DIAGNOSIS — H6121 Impacted cerumen, right ear: Secondary | ICD-10-CM

## 2016-05-29 DIAGNOSIS — J449 Chronic obstructive pulmonary disease, unspecified: Secondary | ICD-10-CM | POA: Insufficient documentation

## 2016-05-29 DIAGNOSIS — F17219 Nicotine dependence, cigarettes, with unspecified nicotine-induced disorders: Secondary | ICD-10-CM

## 2016-05-29 DIAGNOSIS — K219 Gastro-esophageal reflux disease without esophagitis: Secondary | ICD-10-CM

## 2016-05-29 NOTE — Progress Notes (Signed)
BP 120/82 (BP Location: Left Arm, Patient Position: Sitting, Cuff Size: Normal)   Pulse (!) 54   Temp 97.5 F (36.4 C) (Other (Comment))   Ht 5\' 6"  (1.676 m)   Wt 147 lb 9.6 oz (67 kg)   SpO2 92%   BMI 23.82 kg/m    Subjective:    Patient ID: Jody Alvarez, female    DOB: 05/14/1956, 60 y.o.   MRN: [id number]  HPI: Jody Alvarez is a 60 y.o. female presenting on 05/29/2016 for Hypertension   HPI     Pt well today.  C/o R ear problem - doesn't hurt- got small amount reddish material out of it.    She didn't call neuro yet  She is going to quit smoking January 6   Relevant past medical, surgical, family and social history reviewed and updated as indicated. Interim medical history since our last visit reviewed. Allergies and medications reviewed and updated.   Current Outpatient Prescriptions:  .  albuterol (PROVENTIL HFA;VENTOLIN HFA) 108 (90 Base) MCG/ACT inhaler, Inhale 2 puffs into the lungs every 6 (six) hours as needed for wheezing or shortness of breath., Disp: 3 Inhaler, Rfl: 0 .  Amitriptyline HCl (ELAVIL PO), Take 1 tablet by mouth at bedtime as needed., Disp: , Rfl:  .  aspirin 81 MG chewable tablet, Chew 81 mg by mouth daily., Disp: , Rfl:  .  cloNIDine (CATAPRES) 0.2 MG tablet, Take 1 tablet (0.2 mg total) by mouth 2 (two) times daily., Disp: 60 tablet, Rfl: 6 .  hydrochlorothiazide (HYDRODIURIL) 25 MG tablet, TAKE ONE TABLET BY MOUTH ONCE DAILY, Disp: 30 tablet, Rfl: 1 .  ibuprofen (ADVIL,MOTRIN) 800 MG tablet, Take 800 mg by mouth every 12 (twelve) hours as needed., Disp: , Rfl:  .  lisinopril (PRINIVIL,ZESTRIL) 20 MG tablet, Take 2 tablets (40 mg total) by mouth daily., Disp: 60 tablet, Rfl: 1 .  metoprolol (LOPRESSOR) 50 MG tablet, Take 1 tablet (50 mg total) by mouth 2 (two) times daily., Disp: 60 tablet, Rfl: 1 .  omeprazole (PRILOSEC) 20 MG capsule, 1 PO 30 mins prior to breakfast and supper, Disp: 180 capsule, Rfl: 1 .  Tetrahydroz-Glyc-Hyprom-PEG (VISINE  MAXIMUM REDNESS RELIEF) 0.05-0.2-0.36-1 % SOLN, Apply 1 drop to eye daily as needed (for redness relief)., Disp: , Rfl:   Review of Systems  Constitutional: Positive for chills and fatigue. Negative for appetite change, diaphoresis, fever and unexpected weight change.  HENT: Positive for congestion, dental problem, ear pain and sneezing. Negative for drooling, facial swelling, hearing loss, mouth sores, sore throat, trouble swallowing and voice change.   Eyes: Positive for redness. Negative for pain, discharge, itching and visual disturbance.  Respiratory: Positive for cough, shortness of breath and wheezing. Negative for choking.   Cardiovascular: Positive for palpitations. Negative for chest pain and leg swelling.  Gastrointestinal: Negative for abdominal pain, blood in stool, constipation, diarrhea and vomiting.  Endocrine: Negative for cold intolerance, heat intolerance and polydipsia.  Genitourinary: Negative for decreased urine volume, dysuria and hematuria.  Musculoskeletal: Positive for arthralgias and back pain. Negative for gait problem.  Skin: Negative for rash.  Allergic/Immunologic: Negative for environmental allergies.  Neurological: Positive for light-headedness. Negative for seizures, syncope and headaches.  Hematological: Negative for adenopathy.  Psychiatric/Behavioral: Positive for dysphoric mood. Negative for agitation and suicidal ideas. The patient is nervous/anxious.     Per HPI unless specifically indicated above     Objective:    BP 120/82 (BP Location: Left Arm, Patient Position: Sitting, Cuff Size: Normal)  Pulse (!) 54   Temp 97.5 F (36.4 C) (Other (Comment))   Ht 5\' 6"  (1.676 m)   Wt 147 lb 9.6 oz (67 kg)   SpO2 92%   BMI 23.82 kg/m   Wt Readings from Last 3 Encounters:  05/29/16 147 lb 9.6 oz (67 kg)  05/08/16 153 lb 4 oz (69.5 kg)  05/01/16 148 lb (67.1 kg)    Physical Exam  Constitutional: She is oriented to person, place, and time. She appears  well-developed and well-nourished.  HENT:  Head: Normocephalic and atraumatic.  Right Ear: Hearing normal. A foreign body is present.  Left Ear: Hearing, tympanic membrane, external ear and ear canal normal.  Cerumen impaction R ear.  After Lavage, TM nl  Neck: Neck supple.  Cardiovascular: Normal rate and regular rhythm.   Pulmonary/Chest: Effort normal and breath sounds normal.  Abdominal: Soft. Bowel sounds are normal. She exhibits no mass. There is no hepatosplenomegaly. There is no tenderness.  Musculoskeletal: She exhibits no edema.  Lymphadenopathy:    She has no cervical adenopathy.  Neurological: She is alert and oriented to person, place, and time.  Skin: Skin is warm and dry.  Psychiatric: She has a normal mood and affect. Her behavior is normal.  Vitals reviewed.       Assessment & Plan:   Encounter Diagnoses  Name Primary?  . Essential hypertension, benign Yes  . Cigarette nicotine dependence with nicotine-induced disorder   . Impacted cerumen of right ear   . Chronic obstructive pulmonary disease, unspecified COPD type (HCC)   . Gastroesophageal reflux disease, esophagitis presence not specified   . Chronic kidney disease, unspecified CKD stage     -pt to get labs drawn today- will call with results -BP great today!  Continue current medications -pt counseled to call neurology to reschedule her appointment -counseled smoking cessation and wished her much luck with efforts to stop -follow up 2 months.  RTO sooner prn

## 2016-05-30 LAB — BASIC METABOLIC PANEL
BUN: 25 mg/dL (ref 7–25)
CO2: 32 mmol/L — AB (ref 20–31)
Calcium: 9.7 mg/dL (ref 8.6–10.4)
Chloride: 99 mmol/L (ref 98–110)
Creat: 1.51 mg/dL — ABNORMAL HIGH (ref 0.50–0.99)
GLUCOSE: 111 mg/dL — AB (ref 65–99)
POTASSIUM: 3.7 mmol/L (ref 3.5–5.3)
SODIUM: 139 mmol/L (ref 135–146)

## 2016-06-14 ENCOUNTER — Other Ambulatory Visit: Payer: Self-pay | Admitting: Physician Assistant

## 2016-06-14 DIAGNOSIS — I10 Essential (primary) hypertension: Secondary | ICD-10-CM

## 2016-06-14 DIAGNOSIS — N189 Chronic kidney disease, unspecified: Secondary | ICD-10-CM

## 2016-06-27 ENCOUNTER — Encounter: Payer: Self-pay | Admitting: Physician Assistant

## 2016-06-27 ENCOUNTER — Ambulatory Visit: Payer: Self-pay | Admitting: Physician Assistant

## 2016-06-27 VITALS — BP 190/106 | Temp 97.9°F | Ht 66.0 in | Wt 145.2 lb

## 2016-06-27 DIAGNOSIS — J209 Acute bronchitis, unspecified: Secondary | ICD-10-CM

## 2016-06-27 MED ORDER — BENZONATATE 100 MG PO CAPS: ORAL_CAPSULE | ORAL | 3 refills | Status: DC

## 2016-06-27 MED ORDER — AZITHROMYCIN 250 MG PO TABS: ORAL_TABLET | ORAL | 0 refills | Status: AC

## 2016-06-27 NOTE — Progress Notes (Signed)
BP (!) 190/106 (BP Location: Left Arm, Patient Position: Sitting, Cuff Size: Normal)   Temp 97.9 F (36.6 C) (Other (Comment))   Ht 5\' 6"  (1.676 m)   Wt 145 lb 3.2 oz (65.9 kg)   BMI 23.44 kg/m    Subjective:    Patient ID: Jody Alvarez, female    DOB: [birth year], 61 y.o.   MRN: [id number]  HPI: Jaskirat Zertuche is a 61 y.o. female presenting on 06/27/2016 for Cough (since Christmas, gets better, then comes back)   HPI   Chief Complaint  Patient presents with  . Cough    since Christmas, gets better, then comes back     Pt still smokes  Pt tried Vicks vaporub and mucinex but not now.  Pt c/o cough, congestion, intermittent subjective fever.  Not really having EA but sometimes throat hurts.  Sometimes coughs to the point of post tussive emesis.   Relevant past medical, surgical, family and social history reviewed and updated as indicated. Interim medical history since our last visit reviewed. Allergies and medications reviewed and updated.   Current Outpatient Prescriptions:  .  albuterol (PROVENTIL HFA;VENTOLIN HFA) 108 (90 Base) MCG/ACT inhaler, Inhale 2 puffs into the lungs every 6 (six) hours as needed for wheezing or shortness of breath., Disp: 3 Inhaler, Rfl: 0 .  aspirin 81 MG chewable tablet, Chew 81 mg by mouth daily., Disp: , Rfl:  .  cloNIDine (CATAPRES) 0.2 MG tablet, Take 1 tablet (0.2 mg total) by mouth 2 (two) times daily., Disp: 60 tablet, Rfl: 6 .  hydrochlorothiazide (HYDRODIURIL) 25 MG tablet, TAKE ONE TABLET BY MOUTH ONCE DAILY, Disp: 30 tablet, Rfl: 1 .  ibuprofen (ADVIL,MOTRIN) 800 MG tablet, Take 800 mg by mouth every 12 (twelve) hours as needed., Disp: , Rfl:  .  lisinopril (PRINIVIL,ZESTRIL) 20 MG tablet, Take 2 tablets (40 mg total) by mouth daily., Disp: 60 tablet, Rfl: 1 .  metoprolol (LOPRESSOR) 50 MG tablet, Take 1 tablet (50 mg total) by mouth 2 (two) times daily., Disp: 60 tablet, Rfl: 1 .  omeprazole (PRILOSEC) 20 MG capsule, 1 PO 30 mins prior to  breakfast and supper, Disp: 180 capsule, Rfl: 1 .  Tetrahydroz-Glyc-Hyprom-PEG (VISINE MAXIMUM REDNESS RELIEF) 0.05-0.2-0.36-1 % SOLN, Apply 1 drop to eye daily as needed (for redness relief)., Disp: , Rfl:  .  Amitriptyline HCl (ELAVIL PO), Take 1 tablet by mouth at bedtime as needed., Disp: , Rfl:    Review of Systems  Constitutional: Positive for appetite change, chills, diaphoresis, fatigue and fever. Negative for unexpected weight change.  HENT: Positive for congestion, dental problem, sneezing and sore throat. Negative for drooling, ear pain, facial swelling, hearing loss, mouth sores, trouble swallowing and voice change.   Eyes: Positive for redness. Negative for pain, discharge, itching and visual disturbance.  Respiratory: Positive for cough, shortness of breath and wheezing. Negative for choking.   Cardiovascular: Positive for palpitations. Negative for chest pain and leg swelling.  Gastrointestinal: Positive for abdominal pain and vomiting. Negative for blood in stool, constipation and diarrhea.  Endocrine: Negative for cold intolerance, heat intolerance and polydipsia.  Genitourinary: Negative for decreased urine volume, dysuria and hematuria.  Musculoskeletal: Positive for arthralgias and back pain. Negative for gait problem.  Skin: Negative for rash.  Allergic/Immunologic: Negative for environmental allergies.  Neurological: Positive for light-headedness and headaches. Negative for seizures and syncope.  Hematological: Negative for adenopathy.  Psychiatric/Behavioral: Positive for agitation and dysphoric mood. Negative for suicidal ideas. The patient is nervous/anxious.  Per HPI unless specifically indicated above     Objective:    BP (!) 190/106 (BP Location: Left Arm, Patient Position: Sitting, Cuff Size: Normal)   Temp 97.9 F (36.6 C) (Other (Comment))   Ht 5\' 6"  (1.676 m)   Wt 145 lb 3.2 oz (65.9 kg)   BMI 23.44 kg/m   Wt Readings from Last 3 Encounters:   06/27/16 145 lb 3.2 oz (65.9 kg)  05/29/16 147 lb 9.6 oz (67 kg)  05/08/16 153 lb 4 oz (69.5 kg)    Physical Exam  Constitutional: She is oriented to person, place, and time. She appears well-developed and well-nourished.  HENT:  Head: Normocephalic and atraumatic.  Right Ear: Hearing, tympanic membrane, external ear and ear canal normal.  Left Ear: Hearing, tympanic membrane, external ear and ear canal normal.  Nose: Nose normal.  Mouth/Throat: Uvula is midline and oropharynx is clear and moist. No oropharyngeal exudate.  Neck: Neck supple.  Cardiovascular: Normal rate and regular rhythm.   Pulmonary/Chest: Effort normal and breath sounds normal. She has no wheezes.  Lymphadenopathy:    She has no cervical adenopathy.  Neurological: She is alert and oriented to person, place, and time.  Skin: Skin is warm and dry.  Psychiatric: She has a normal mood and affect. Her behavior is normal.  Vitals reviewed.       Assessment & Plan:     Encounter Diagnosis  Name Primary?  . Acute bronchitis, unspecified organism Yes   -rx zpack and tessalon.  Counseled to avoid smoking to get better sooner.  Encouraged rest, fluids. -follow up as scheduled.  RTO sooner if worsens or persists

## 2016-07-05 ENCOUNTER — Ambulatory Visit: Payer: Self-pay | Admitting: Gastroenterology

## 2016-07-25 ENCOUNTER — Ambulatory Visit: Payer: Self-pay | Admitting: Gastroenterology

## 2016-07-31 ENCOUNTER — Other Ambulatory Visit: Payer: Self-pay

## 2016-07-31 DIAGNOSIS — I10 Essential (primary) hypertension: Secondary | ICD-10-CM

## 2016-07-31 DIAGNOSIS — N189 Chronic kidney disease, unspecified: Secondary | ICD-10-CM

## 2016-08-01 ENCOUNTER — Ambulatory Visit: Payer: Self-pay | Admitting: Gastroenterology

## 2016-08-07 ENCOUNTER — Ambulatory Visit: Payer: Self-pay | Admitting: Physician Assistant

## 2016-08-07 ENCOUNTER — Encounter: Payer: Self-pay | Admitting: Physician Assistant

## 2016-08-07 VITALS — BP 130/98 | HR 68 | Temp 97.7°F | Ht 66.0 in | Wt 135.5 lb

## 2016-08-07 DIAGNOSIS — N189 Chronic kidney disease, unspecified: Secondary | ICD-10-CM

## 2016-08-07 DIAGNOSIS — F17219 Nicotine dependence, cigarettes, with unspecified nicotine-induced disorders: Secondary | ICD-10-CM

## 2016-08-07 DIAGNOSIS — J449 Chronic obstructive pulmonary disease, unspecified: Secondary | ICD-10-CM

## 2016-08-07 DIAGNOSIS — I10 Essential (primary) hypertension: Secondary | ICD-10-CM

## 2016-08-07 NOTE — Progress Notes (Signed)
BP (!) 130/98 (BP Location: Right Arm, Patient Position: Sitting, Cuff Size: Normal)   Pulse 68   Temp 97.7 F (36.5 C)   Ht 5\' 6"  (1.676 m)   Wt 135 lb 8 oz (61.5 kg)   SpO2 93%   BMI 21.87 kg/m    Subjective:    Patient ID: Jody HaradaLinda Rabel, female    DOB: 06/06/1956, 61 y.o.   MRN: [id number]  HPI: Jody Alvarez is a 61 y.o. female presenting on 08/07/2016 for Hypertension   HPI   Pt didn't get her labs drawn.  She is hoping to go tomorrow for that.  Pt did not call the neurologist as instructed.   She did not quit smoking  [facility]    Relevant past medical, surgical, family and social history reviewed and updated as indicated. Interim medical history since our last visit reviewed. Allergies and medications reviewed and updated.   Current Outpatient Prescriptions:  .  albuterol (PROVENTIL HFA;VENTOLIN HFA) 108 (90 Base) MCG/ACT inhaler, Inhale 2 puffs into the lungs every 6 (six) hours as needed for wheezing or shortness of breath., Disp: 3 Inhaler, Rfl: 0 .  aspirin 81 MG chewable tablet, Chew 81 mg by mouth daily., Disp: , Rfl:  .  cloNIDine (CATAPRES) 0.2 MG tablet, Take 1 tablet (0.2 mg total) by mouth 2 (two) times daily., Disp: 60 tablet, Rfl: 6 .  hydrochlorothiazide (HYDRODIURIL) 25 MG tablet, TAKE ONE TABLET BY MOUTH ONCE DAILY, Disp: 30 tablet, Rfl: 1 .  ibuprofen (ADVIL,MOTRIN) 800 MG tablet, Take 800 mg by mouth every 12 (twelve) hours as needed., Disp: , Rfl:  .  lisinopril (PRINIVIL,ZESTRIL) 20 MG tablet, Take 2 tablets (40 mg total) by mouth daily., Disp: 60 tablet, Rfl: 1 .  metoprolol (LOPRESSOR) 50 MG tablet, Take 1 tablet (50 mg total) by mouth 2 (two) times daily., Disp: 60 tablet, Rfl: 1 .  omeprazole (PRILOSEC) 20 MG capsule, 1 PO 30 mins prior to breakfast and supper, Disp: 180 capsule, Rfl: 1 .  Tetrahydroz-Glyc-Hyprom-PEG (VISINE MAXIMUM REDNESS RELIEF) 0.05-0.2-0.36-1 % SOLN, Apply 1 drop to eye daily as needed (for redness  relief)., Disp: , Rfl:     Current Outpatient Prescriptions:  .  albuterol (PROVENTIL HFA;VENTOLIN HFA) 108 (90 Base) MCG/ACT inhaler, Inhale 2 puffs into the lungs every 6 (six) hours as needed for wheezing or shortness of breath., Disp: 3 Inhaler, Rfl: 0 .  aspirin 81 MG chewable tablet, Chew 81 mg by mouth daily., Disp: , Rfl:  .  cloNIDine (CATAPRES) 0.2 MG tablet, Take 1 tablet (0.2 mg total) by mouth 2 (two) times daily., Disp: 60 tablet, Rfl: 6 .  hydrochlorothiazide (HYDRODIURIL) 25 MG tablet, TAKE ONE TABLET BY MOUTH ONCE DAILY, Disp: 30 tablet, Rfl: 1 .  ibuprofen (ADVIL,MOTRIN) 800 MG tablet, Take 800 mg by mouth every 12 (twelve) hours as needed., Disp: , Rfl:  .  lisinopril (PRINIVIL,ZESTRIL) 20 MG tablet, Take 2 tablets (40 mg total) by mouth daily., Disp: 60 tablet, Rfl: 1 .  metoprolol (LOPRESSOR) 50 MG tablet, Take 1 tablet (50 mg total) by mouth 2 (two) times daily., Disp: 60 tablet, Rfl: 1 .  omeprazole (PRILOSEC) 20 MG capsule, 1 PO 30 mins prior to breakfast and supper, Disp: 180 capsule, Rfl: 1 .  Tetrahydroz-Glyc-Hyprom-PEG (VISINE MAXIMUM REDNESS RELIEF) 0.05-0.2-0.36-1 % SOLN, Apply 1 drop to eye daily as needed (for redness relief)., Disp: , Rfl:    Review of Systems  Eyes: Negative for visual disturbance.  Cardiovascular:  Negative for chest pain.  Gastrointestinal: Negative for abdominal pain.  Neurological: Negative for headaches.    Per HPI unless specifically indicated above     Objective:    BP (!) 130/98 (BP Location: Right Arm, Patient Position: Sitting, Cuff Size: Normal)   Pulse 68   Temp 97.7 F (36.5 C)   Ht 5\' 6"  (1.676 m)   Wt 135 lb 8 oz (61.5 kg)   SpO2 93%   BMI 21.87 kg/m   Wt Readings from Last 3 Encounters:  08/07/16 135 lb 8 oz (61.5 kg)  06/27/16 145 lb 3.2 oz (65.9 kg)  05/29/16 147 lb 9.6 oz (67 kg)    Physical Exam  Constitutional: She is oriented to person, place, and time. She appears well-developed and well-nourished.   HENT:  Head: Normocephalic and atraumatic.  Neck: Neck supple.  Cardiovascular: Normal rate and regular rhythm.   Pulmonary/Chest: Effort normal and breath sounds normal.  Abdominal: Soft. Bowel sounds are normal. She exhibits no mass. There is no hepatosplenomegaly. There is no tenderness.  Musculoskeletal: She exhibits no edema.       Right knee: Normal.       Left knee: Normal.  Lymphadenopathy:    She has no cervical adenopathy.  Neurological: She is alert and oriented to person, place, and time.  Skin: Skin is warm and dry.  Psychiatric: She has a normal mood and affect. Her behavior is normal.  Vitals reviewed.   Results for orders placed or performed in visit on 05/08/16  Basic Metabolic Panel (BMET)  Result Value Ref Range   Sodium 139 135 - 146 mmol/L   Potassium 3.7 3.5 - 5.3 mmol/L   Chloride 99 98 - 110 mmol/L   CO2 32 (H) 20 - 31 mmol/L   Glucose, Bld 111 (H) 65 - 99 mg/dL   BUN 25 7 - 25 mg/dL   Creat 1.61 (H) 0.96 - 0.99 mg/dL   Calcium 9.7 8.6 - 04.5 mg/dL      Assessment & Plan:    Encounter Diagnoses  Name Primary?  . Essential hypertension, benign Yes  . Cigarette nicotine dependence with nicotine-induced disorder   . Chronic obstructive pulmonary disease, unspecified COPD type (HCC)   . Chronic kidney disease, unspecified CKD stage     -pt is urged to get labs drawn as her Cr needs to be rechecked.   -pt encouraged to call the neurologist for appointment as previously instructed.  She is given the phone number -no changes in medication today.  Continue current meds -pt to continue with GI for abdominal pain and hepatitis -counseled smoking cessation -follow up 3 months.  RTO sooner prn

## 2016-08-07 NOTE — Patient Instructions (Signed)
Call neurology for appointment [facility] [phone]

## 2016-08-15 ENCOUNTER — Encounter (HOSPITAL_COMMUNITY): Payer: Self-pay | Admitting: *Deleted

## 2016-08-15 ENCOUNTER — Emergency Department (HOSPITAL_COMMUNITY)
Admission: EM | Admit: 2016-08-15 | Discharge: 2016-08-15 | Disposition: A | Discharge status: Home / Self Care | Payer: Self-pay | Attending: Emergency Medicine | Admitting: Emergency Medicine

## 2016-08-15 DIAGNOSIS — R42 Dizziness and giddiness: Secondary | ICD-10-CM

## 2016-08-15 DIAGNOSIS — F17210 Nicotine dependence, cigarettes, uncomplicated: Secondary | ICD-10-CM | POA: Insufficient documentation

## 2016-08-15 DIAGNOSIS — Z79899 Other long term (current) drug therapy: Secondary | ICD-10-CM | POA: Insufficient documentation

## 2016-08-15 DIAGNOSIS — N189 Chronic kidney disease, unspecified: Secondary | ICD-10-CM

## 2016-08-15 DIAGNOSIS — I129 Hypertensive chronic kidney disease with stage 1 through stage 4 chronic kidney disease, or unspecified chronic kidney disease: Secondary | ICD-10-CM | POA: Insufficient documentation

## 2016-08-15 DIAGNOSIS — J449 Chronic obstructive pulmonary disease, unspecified: Secondary | ICD-10-CM | POA: Insufficient documentation

## 2016-08-15 DIAGNOSIS — N179 Acute kidney failure, unspecified: Secondary | ICD-10-CM

## 2016-08-15 LAB — URINALYSIS, ROUTINE W REFLEX MICROSCOPIC
Bilirubin Urine: NEGATIVE
GLUCOSE, UA: NEGATIVE mg/dL
HGB URINE DIPSTICK: NEGATIVE
Ketones, ur: NEGATIVE mg/dL
LEUKOCYTES UA: NEGATIVE
NITRITE: NEGATIVE
Protein, ur: 30 mg/dL — AB
SPECIFIC GRAVITY, URINE: 1.01 (ref 1.005–1.030)
pH: 5 (ref 5.0–8.0)

## 2016-08-15 LAB — BASIC METABOLIC PANEL
Anion gap: 13 (ref 5–15)
BUN: 22 mg/dL — AB (ref 6–20)
CO2: 27 mmol/L (ref 22–32)
CREATININE: 2.27 mg/dL — AB (ref 0.44–1.00)
Calcium: 9.5 mg/dL (ref 8.9–10.3)
Chloride: 95 mmol/L — ABNORMAL LOW (ref 101–111)
GFR calc Af Amer: 26 mL/min — ABNORMAL LOW (ref >60)
GFR, EST NON AFRICAN AMERICAN: 22 mL/min — AB (ref >60)
Glucose, Bld: 101 mg/dL — ABNORMAL HIGH (ref 65–99)
Potassium: 3.5 mmol/L (ref 3.5–5.1)
SODIUM: 135 mmol/L (ref 135–145)

## 2016-08-15 LAB — CBC
HCT: 45.7 % (ref 36.0–46.0)
Hemoglobin: 15.5 g/dL — ABNORMAL HIGH (ref 12.0–15.0)
MCH: 30.7 pg (ref 26.0–34.0)
MCHC: 33.9 g/dL (ref 30.0–36.0)
MCV: 90.5 fL (ref 78.0–100.0)
PLATELETS: 208 10*3/uL (ref 150–400)
RBC: 5.05 MIL/uL (ref 3.87–5.11)
RDW: 13.3 % (ref 11.5–15.5)
WBC: 9.9 10*3/uL (ref 4.0–10.5)

## 2016-08-15 LAB — CREATININE, URINE, RANDOM: Creatinine, Urine: 101.11 mg/dL

## 2016-08-15 LAB — SODIUM, URINE, RANDOM: Sodium, Ur: 39 mmol/L

## 2016-08-15 LAB — CBG MONITORING, ED: GLUCOSE-CAPILLARY: 115 mg/dL — AB (ref 65–99)

## 2016-08-15 MED ORDER — SODIUM CHLORIDE 0.9 % IV BOLUS (SEPSIS)
1000.0000 mL | Freq: Once | INTRAVENOUS | Status: AC
  Administered 2016-08-15: 1000 mL via INTRAVENOUS

## 2016-08-15 NOTE — Consult Note (Signed)
History and Physical  Patient Name: Jody Alvarez     ZOX:[id number]    DOB: March 11, 1956    DOA: 08/15/2016 PCP: Jacquelin Hawking, PA-C   Patient coming from: Home  Chief Complaint: Dizziness  HPI: Jody Alvarez is a 61 y.o. female with a past medical history significant for COPD, HTN, and CKD baseline Cr 1.1-1.5 who presents with dizziness and hypotension.  The patient was in her usual state of health until today, she woke up throwing up a few times, then today she noticed she was eating less often than usual, and then in the evening, she was felt dizzy while working in the kitchen, sat down checked her blood pressure and it was 69 systolic.  She tried drinking some fluids and kept checking her BP over a few hours, but started to worry that despite drinking a lot of fluids she wasn't urinating, remembered when she had an episode of renal failure in the past and so came to the ER.    ED course: -Afebrile, heart rate 51, respirations and pulse ox normal, blood pressure 113/79 -Orthostatic vital signs showed a very slight drop in blood pressure with standing, no symptoms -Na 135, K 3.5, Cr 2.27 (baseline 1.5), WBC 9.9K, Hgb 15.5 -UA showed no RBCs, WBCs, few bland casts, FeNA 0.6% -ECG showed sinus bradycardia, similar to previous -She was given 1L fluids and TRH were asked to evaluate    She has been using ibuprofen 800s, but none in the past few weeks.  No new medications recently.  She does take omeprazole, but not in the last few weeks.  She takes 4 blood pressure medicines, takes them regularly, including ACEi.  Other than the episode this morning, she has had no prolonged vomiting, diarrhea, or other volume loss.  No recent fever, chills, cough, dysuria.  Her PCP checked her creatinine recently, noticed it was "higher than it should be" and had plans to repeat it this week.      ROS: Review of Systems  Gastrointestinal: Positive for vomiting.  Neurological: Positive for dizziness.  All  other systems reviewed and are negative.         Past Medical History:  Diagnosis Date  . Anxiety 2008   lost 4 close family members in 4 yrs, "doesn't like to leave house"  . Anxiety    plans to return to [facility] for counseling, has been in past  . Chronic hepatitis C (HCC)   . COPD (chronic obstructive pulmonary disease) (HCC)   . GERD (gastroesophageal reflux disease)   . Hypertension     Past Surgical History:  Procedure Laterality Date  . BIOPSY  03/21/2016   Procedure: BIOPSY;  Surgeon: West Bali, MD;  Location: [facility];  Service: Endoscopy;;  gastric   . COLONOSCOPY WITH PROPOFOL N/A 03/21/2016   Procedure: COLONOSCOPY WITH PROPOFOL;  Surgeon: West Bali, MD;  Location: [facility];  Service: Endoscopy;  Laterality: N/A;  115  . DILATION AND CURETTAGE OF UTERUS    . ESOPHAGOGASTRODUODENOSCOPY (EGD) WITH PROPOFOL N/A 03/21/2016   Procedure: ESOPHAGOGASTRODUODENOSCOPY (EGD) WITH PROPOFOL;  Surgeon: West Bali, MD;  Location: [facility];  Service: Endoscopy;  Laterality: N/A;  . RECTOCELE REPAIR    . VAGINAL HYSTERECTOMY     partial    Social History: She   reports that she has been smoking Cigarettes.  She started smoking about 47 years ago. She has a 50.00 pack-year smoking history. She has never used smokeless tobacco.  She reports that she does not drink alcohol or use drugs.  Allergies  Allergen Reactions  . Quetiapine Palpitations  . Trazodone And Nefazodone Anxiety and Palpitations  . Flexeril [Cyclobenzaprine] Other (See Comments)    Hallucinations   . Phenergan [Promethazine Hcl] Other (See Comments)    Hallucinations    Family history: family history includes Cancer in her father, maternal grandfather, mother, sister, sister, and sister; Diabetes in her mother.  Prior to Admission medications   Medication Sig Start Date End Date Taking? Authorizing Provider  cloNIDine (CATAPRES) 0.2 MG tablet Take 1 tablet (0.2 mg total) by mouth  2 (two) times daily. 01/31/16  Yes Laqueta Linden, MD  hydrochlorothiazide (HYDRODIURIL) 25 MG tablet TAKE ONE TABLET BY MOUTH ONCE DAILY 03/13/16  Yes Jacquelin Hawking, PA-C  lisinopril (PRINIVIL,ZESTRIL) 20 MG tablet Take 2 tablets (40 mg total) by mouth daily. 01/03/16  Yes Jacquelin Hawking, PA-C  metoprolol (LOPRESSOR) 50 MG tablet Take 1 tablet (50 mg total) by mouth 2 (two) times daily. 04/03/16  Yes Jacquelin Hawking, PA-C  omeprazole (PRILOSEC) 20 MG capsule 1 PO 30 mins prior to breakfast and supper Patient taking differently: Take 20 mg by mouth 2 (two) times daily before a meal.  04/03/16  Yes Jacquelin Hawking, PA-C  Tetrahydroz-Glyc-Hyprom-PEG (VISINE MAXIMUM REDNESS RELIEF) 0.05-0.2-0.36-1 % SOLN Apply 1 drop to eye daily as needed (for redness relief).   Yes Historical Provider, MD  albuterol (PROVENTIL HFA;VENTOLIN HFA) 108 (90 Base) MCG/ACT inhaler Inhale 2 puffs into the lungs every 6 (six) hours as needed for wheezing or shortness of breath. 11/02/15   Jacquelin Hawking, PA-C  ibuprofen (ADVIL,MOTRIN) 800 MG tablet Take 800 mg by mouth every 12 (twelve) hours as needed for headache or moderate pain.     Historical Provider, MD       Physical Exam: BP 145/85   Pulse (!) 50   Temp 97.5 F (36.4 C) (Oral)   Resp 12   SpO2 100%  General appearance: Well-developed, thin adult female, alert and in no acute distress.   Eyes: Anicteric, conjunctiva pink, lids and lashes normal. PERRL.    ENT: No nasal deformity, discharge, epistaxis.  Hearing normal. OP moist without lesions.   Neck: No neck masses.  Trachea midline.  No thyromegaly/tenderness. Lymph: No cervical or supraclavicular lymphadenopathy. Skin: Warm and dry.  No jaundice.  No suspicious rashes or lesions. Cardiac: RRR, nl S1-S2, no murmurs appreciated.  Capillary refill is brisk.  JVP not visible.  No LE edema.  Radial and DP pulses 2+ and symmetric. Respiratory: Normal respiratory rate and rhythm.  CTAB without rales or  wheezes. Abdomen: Abdomen soft.  No TTP. No ascites, distension, hepatosplenomegaly.   MSK: No deformities or effusions.  No cyanosis or clubbing. Neuro: Cranial nerves normal.  Sensation intact to light touch. Speech is fluent.  Muscle strength normal.    Psych: Sensorium intact and responding to questions, attention normal.  Behavior appropriate.  Affect normal.  Judgment and insight appear normal.     Labs on Admission:  I have personally reviewed following labs and imaging studies: CBC:  Recent Labs Lab 08/15/16 0235  WBC 9.9  HGB 15.5*  HCT 45.7  MCV 90.5  PLT 208   Basic Metabolic Panel:  Recent Labs Lab 08/15/16 0235  NA 135  K 3.5  CL 95*  CO2 27  GLUCOSE 101*  BUN 22*  CREATININE 2.27*  CALCIUM 9.5   GFR: Estimated Creatinine Clearance: 24.7 mL/min (by C-G formula based on  SCr of 2.27 mg/dL (H)).  Liver Function Tests: No results for input(s): AST, ALT, ALKPHOS, BILITOT, PROT, ALBUMIN in the last 168 hours. No results for input(s): LIPASE, AMYLASE in the last 168 hours. No results for input(s): AMMONIA in the last 168 hours. Coagulation Profile: No results for input(s): INR, PROTIME in the last 168 hours. Cardiac Enzymes: No results for input(s): CKTOTAL, CKMB, CKMBINDEX, TROPONINI in the last 168 hours. BNP (last 3 results) No results for input(s): PROBNP in the last 8760 hours. HbA1C: No results for input(s): HGBA1C in the last 72 hours. CBG:  Recent Labs Lab 08/15/16 0322  GLUCAP 115*   Lipid Profile: No results for input(s): CHOL, HDL, LDLCALC, TRIG, CHOLHDL, LDLDIRECT in the last 72 hours. Thyroid Function Tests: No results for input(s): TSH, T4TOTAL, FREET4, T3FREE, THYROIDAB in the last 72 hours. Anemia Panel: No results for input(s): VITAMINB12, FOLATE, FERRITIN, TIBC, IRON, RETICCTPCT in the last 72 hours. Sepsis Labs: Invalid input(s): PROCALCITONIN, LACTICIDVEN No results found for this or any previous visit (from the past 240  hour(s)).       EKG: Independently reviewed. Rate 55, QTc 436, no St changes.    Assessment/Plan  1. Acute kidney injury:  She appears to have an increase in her creatinine from baseline CKD with creatinine 1.5 or so.  She reports a history of AKI.  Urine electrolytes and UA microscopy suggests pre-renal azotemia.  Discussed conservative mgmt observing in hospital vs repeat creatinine as an outpatient, and patient prefers latter. -Push fluids -[facility]  -Check BP daily -Restart clonidine if needed, then HCTZ -Hold ACEi  -Avoid all NSAIDs permanently -Doubt AIN, but refrain from omeprazole  -Message sent to PCP via Inbasket, patient will call today for follow up -Return precautions given         Medical decision making: Patient seen at 4:22 AM on 08/15/2016.  The patient was discussed with Dr. Rhunette CroftNanavati.  What exists of the patient's chart was reviewed in depth and summarized above.  Clinical condition: stable.        Alberteen SamChristopher P Christiaan Strebeck [facility] Pager [phone]

## 2016-08-15 NOTE — ED Provider Notes (Signed)
[facility]-EMERGENCY DEPT Provider Note   CSN: [id number] Arrival date & time: 08/15/16  0222   By signing my name below, I, Clovis Pu, attest that this documentation has been prepared under the direction and in the presence of Derwood Kaplan, MD  Electronically Signed: Clovis Pu, ED Scribe. 08/15/16. 3:46 AM.   History   Chief Complaint Chief Complaint  Patient presents with  . Hypotension  . Dizziness   The history is provided by the patient. No language interpreter was used.   HPI Comments:  Jody Alvarez is a 61 y.o. female, with a PMHx of GERD, COPD, HTN on metoprolol and clonidine and CKD, who presents to the Emergency Department complaining of dizziness and confusion onset 9:30 PM yesterday. She states she was trying to make herself tea and notes she was confused while doing this. Pt reports she was stumbling and running into things in her kitchen, notes a decrease in blood pressure (69/59 PTA), nausea, vomiting and a decrease in urinary output. She notes she recovered from a recent cold. No alleviating factors noted. Pt denies back pain, numbness to lower extremities, saddle anesthesia, weakness to lower extremities, bowel/bladder incontinence, a headache, a hx of heart attacks, hx of blood clots, any recent long distance travel, any recent surgeries, a hx of hormone therapy, current diuretic use or any other associated symptoms.   Past Medical History:  Diagnosis Date  . Anxiety 2008   lost 4 close family members in 4 yrs, "doesn't like to leave house"  . Anxiety    plans to return to [facility] for counseling, has been in past  . Chronic hepatitis C (HCC)   . COPD (chronic obstructive pulmonary disease) (HCC)   . GERD (gastroesophageal reflux disease)   . Hypertension     Patient Active Problem List   Diagnosis Date Noted  . Chronic obstructive pulmonary disease (HCC) 05/29/2016  . Blood in stool   . Cephalalgia 01/10/2016  . Faintness 01/10/2016  . Ectatic aorta  (HCC) 12/07/2015  . Abdominal pain 11/23/2015  . Chronic hepatitis C without hepatic coma (HCC) 11/01/2015  . Cigarette nicotine dependence with nicotine-induced disorder 11/01/2015  . Fecal occult blood test positive 11/01/2015  . Cough 10/04/2015  . Diarrhea 10/04/2015  . History of hepatitis 10/04/2015  . Abnormal mammogram 10/04/2015  . CKD (chronic kidney disease) 10/04/2015  . Essential hypertension, benign 09/15/2015  . Cigarette nicotine dependence without complication 09/15/2015  . Anxiety disorder 09/15/2015    Past Surgical History:  Procedure Laterality Date  . BIOPSY  03/21/2016   Procedure: BIOPSY;  Surgeon: West Bali, MD;  Location: [facility];  Service: Endoscopy;;  gastric   . COLONOSCOPY WITH PROPOFOL N/A 03/21/2016   Procedure: COLONOSCOPY WITH PROPOFOL;  Surgeon: West Bali, MD;  Location: [facility];  Service: Endoscopy;  Laterality: N/A;  115  . DILATION AND CURETTAGE OF UTERUS    . ESOPHAGOGASTRODUODENOSCOPY (EGD) WITH PROPOFOL N/A 03/21/2016   Procedure: ESOPHAGOGASTRODUODENOSCOPY (EGD) WITH PROPOFOL;  Surgeon: West Bali, MD;  Location: [facility];  Service: Endoscopy;  Laterality: N/A;  . RECTOCELE REPAIR    . VAGINAL HYSTERECTOMY     partial    OB History    No data available       Home Medications    Prior to Admission medications   Medication Sig Start Date End Date Taking? Authorizing Provider  cloNIDine (CATAPRES) 0.2 MG tablet Take 1 tablet (0.2 mg total) by mouth 2 (two) times daily.  01/31/16  Yes Laqueta LindenSuresh A Koneswaran, MD  hydrochlorothiazide (HYDRODIURIL) 25 MG tablet TAKE ONE TABLET BY MOUTH ONCE DAILY 03/13/16  Yes Jacquelin HawkingShannon McElroy, PA-C  lisinopril (PRINIVIL,ZESTRIL) 20 MG tablet Take 2 tablets (40 mg total) by mouth daily. 01/03/16  Yes Jacquelin HawkingShannon McElroy, PA-C  metoprolol (LOPRESSOR) 50 MG tablet Take 1 tablet (50 mg total) by mouth 2 (two) times daily. 04/03/16  Yes Jacquelin HawkingShannon McElroy, PA-C  omeprazole (PRILOSEC) 20 MG capsule  1 PO 30 mins prior to breakfast and supper Patient taking differently: Take 20 mg by mouth 2 (two) times daily before a meal.  04/03/16  Yes Jacquelin HawkingShannon McElroy, PA-C  Tetrahydroz-Glyc-Hyprom-PEG (VISINE MAXIMUM REDNESS RELIEF) 0.05-0.2-0.36-1 % SOLN Apply 1 drop to eye daily as needed (for redness relief).   Yes Historical Provider, MD  albuterol (PROVENTIL HFA;VENTOLIN HFA) 108 (90 Base) MCG/ACT inhaler Inhale 2 puffs into the lungs every 6 (six) hours as needed for wheezing or shortness of breath. 11/02/15   Jacquelin HawkingShannon McElroy, PA-C  ibuprofen (ADVIL,MOTRIN) 800 MG tablet Take 800 mg by mouth every 12 (twelve) hours as needed for headache or moderate pain.     Historical Provider, MD    Family History Family History  Problem Relation Age of Onset  . Diabetes Mother   . Cancer Mother     uterine  . Cancer Father     unknown primary  . Cancer Sister     pancreatic  . Cancer Sister     cervical  . Cancer Sister     cervical  . Cancer Maternal Grandfather     lung  . Colon cancer Neg Hx   . Liver disease Neg Hx     Social History Social History  Substance Use Topics  . Smoking status: Current Every Day Smoker    Packs/day: 1.00    Years: 50.00    Types: Cigarettes    Start date: 01/30/1969  . Smokeless tobacco: Never Used     Comment: interested in Chantix  . Alcohol use No     Allergies   Quetiapine; Trazodone and nefazodone; Flexeril [cyclobenzaprine]; and Phenergan [promethazine hcl]   Review of Systems Review of Systems 10 systems reviewed and all are negative for acute change except as noted in the HPI.  Physical Exam Updated Vital Signs BP 145/85   Pulse (!) 50   Temp 97.5 F (36.4 C) (Oral)   Resp 12   SpO2 100%   Physical Exam  Constitutional: She is oriented to person, place, and time. She appears well-developed and well-nourished.  HENT:  Head: Normocephalic.  Mouth/Throat: Mucous membranes are dry (slightly).  Eyes: EOM are normal.  Neck: Normal range  of motion.  Cardiovascular: Regular rhythm.  Bradycardia present.   No murmur heard. Pulmonary/Chest: Effort normal.  Abdominal: Bowel sounds are normal. She exhibits no distension.  Musculoskeletal: Normal range of motion.  Neurological: She is alert and oriented to person, place, and time. No cranial nerve deficit or sensory deficit.  CN 2-12 intact. Upper and lower extermity sensory exam normal. 4+/5 upper extermity strength. Cerebellar exam reveals no dysmetria. Gait is normal and steady.   Skin: Capillary refill takes 2 to 3 seconds.  Cap refill at 3 seconds   Psychiatric: She has a normal mood and affect.  Nursing note and vitals reviewed.   ED Treatments / Results  DIAGNOSTIC STUDIES:  Oxygen Saturation is 99% on RA, normal by my interpretation.    COORDINATION OF CARE:  3:45 AM Discussed treatment plan with pt at  bedside and pt agreed to plan.  Labs (all labs ordered are listed, but only abnormal results are displayed) Labs Reviewed  BASIC METABOLIC PANEL - Abnormal; Notable for the following:       Result Value   Chloride 95 (*)    Glucose, Bld 101 (*)    BUN 22 (*)    Creatinine, Ser 2.27 (*)    GFR calc non Af Amer 22 (*)    GFR calc Af Amer 26 (*)    All other components within normal limits  CBC - Abnormal; Notable for the following:    Hemoglobin 15.5 (*)    All other components within normal limits  URINALYSIS, ROUTINE W REFLEX MICROSCOPIC - Abnormal; Notable for the following:    APPearance HAZY (*)    Protein, ur 30 (*)    Bacteria, UA RARE (*)    Squamous Epithelial / LPF 0-5 (*)    All other components within normal limits  CBG MONITORING, ED - Abnormal; Notable for the following:    Glucose-Capillary 115 (*)    All other components within normal limits  SODIUM, URINE, RANDOM  CREATININE, URINE, RANDOM    EKG  EKG Interpretation  Date/Time:  Tuesday August 15 2016 02:31:07 EST Ventricular Rate:  55 PR Interval:  160 QRS Duration: 74 QT  Interval:  456 QTC Calculation: 436 R Axis:   3 Text Interpretation:  Sinus bradycardia Possible Left atrial enlargement Cannot rule out Anterior infarct , age undetermined Abnormal ECG No acute changes Confirmed by Rhunette Croft, MD, Janey Genta [phone]) on 08/15/2016 3:37:59 AM       Radiology No results found.  Procedures Procedures (including critical care time)  Medications Ordered in ED Medications  sodium chloride 0.9 % bolus 1,000 mL (0 mLs Intravenous Stopped 08/15/16 0528)     Initial Impression / Assessment and Plan / ED Course  I have reviewed the triage vital signs and the nursing notes.  Pertinent labs & imaging results that were available during my care of the patient were reviewed by me and considered in my medical decision making (see chart for details).  Clinical Course as of Aug 15 624  Tue Aug 15, 2016  0624 Dr. Maryfrances Bunnell, Hospitalist was called for admission. He independently assessed the patient and the patient and he feel it is safe for discharge. Pt reports to me that she can see her PCP immediately. Orthostatics neg. FENA does show the AKI is pre-renal and Dr. Maryfrances Bunnell was requested by me to talk to the patient about BP meds and nephrotoxic meds. Dr. Maryfrances Bunnell has placed his recommendations - and I have conveyed them to the patient.  Strict ER return precautions have been discussed, and patient is agreeing with the plan and is comfortable with the workup done and the recommendations from the ER.   [AN]    Clinical Course User Index [AN] Derwood Kaplan, MD    I personally performed the services described in this documentation, which was scribed in my presence. The recorded information has been reviewed and is accurate.   Pt comes in with cc of dizziness, and low BP. She also had ataxia. Pt has hx of HTN, COPD. No hx of strokes, ACS. Pt reports that her BP was low. She is on 3 anti-hypertensive and not voiding like usual. Pt has no back pain, numbness, weakness,  urinary incontinence, bowel incontinence, pins and needle sensation in the perineal area.  Concerns for dehydration, AKI, medication side effects.  Labs show AKI. We will  add orthostatics. We will check for FENA and do a bladder scan as well.   Final Clinical Impressions(s) / ED Diagnoses   Final diagnoses:  AKI (acute kidney injury) [facility])  Dizziness    New Prescriptions Discharge Medication List as of 08/15/2016  6:12 AM       Derwood Kaplan, MD 08/15/16 [phone]

## 2016-08-15 NOTE — ED Triage Notes (Signed)
Pt was home tonight, felt confused and dizzy, decided to check her bp. First reading was 69/47, with several similar readings after. Pt also c/o sharp pain between her shoulder blades for more than 2 weeks ago

## 2016-08-15 NOTE — Discharge Instructions (Signed)
Push fluids today. Check your blood pressure daily. If the blood pressure is >160/100 (either one), you should take your clonidine. If the blood pressure is >180/110 (either one), you should take your clonidine AND HCTZ Do not take the lisinopril or metoprolol for now.     Do not take ibuprofen, Motrin, naproxen, Aleve or any other NSAIDs for now. Do not take the antacid omeprazole/Prilosec for now.  A good alternative is pantoprazole/Protonix.  I have sent that information to [facility], Inchannon McElroy.       Get your blood work on Wednesday 08/16/16. Call Carollee HerterShannon McElroy's office today (Tuesday) to let them know you were in the hospital (they will be able to see my and Dr. Brandy HaleNanavati's notes).     Return to the hospital for dizziness, feeling like you might pass out, or if any of your symptoms get worse.

## 2016-08-15 NOTE — ED Notes (Signed)
ED Provider at bedside. 

## 2016-08-18 ENCOUNTER — Telehealth: Payer: Self-pay | Admitting: Gastroenterology

## 2016-08-18 ENCOUNTER — Ambulatory Visit: Payer: Self-pay | Admitting: Gastroenterology

## 2016-08-18 ENCOUNTER — Encounter: Payer: Self-pay | Admitting: Gastroenterology

## 2016-08-18 NOTE — Telephone Encounter (Signed)
PATIENT WAS A NO SHOW AND LETTER SENT  °

## 2016-08-23 ENCOUNTER — Telehealth: Payer: Self-pay | Admitting: Student

## 2016-08-23 NOTE — Telephone Encounter (Signed)
-----   Message from Jacquelin HawkingShannon McElroy, [place] sent at 08/21/2016 10:22 AM EST ----- Please call pt and remind her to get labs drawn.  She needs bmp to recheck her kidney function.  Please document with phone note in chart. thanks

## 2016-08-23 NOTE — Telephone Encounter (Signed)
Called pt and reminded her to get labs drawn. Pt was notified she needs BMP to rechecked kidney function. Pt states she will contact her sister to giver a ride since she does not have a vehicle. Pt was given [facility] information for transportation. Pt states she will try to get to the lab ASAP.

## 2016-08-26 ENCOUNTER — Emergency Department (HOSPITAL_COMMUNITY): Payer: Medicaid Other

## 2016-08-26 ENCOUNTER — Inpatient Hospital Stay (HOSPITAL_COMMUNITY)
Admission: EM | Admit: 2016-08-26 | Discharge: 2016-09-17 | DRG: 917 | Disposition: E | Payer: Medicaid Other | Attending: Internal Medicine | Admitting: Internal Medicine

## 2016-08-26 ENCOUNTER — Encounter (HOSPITAL_COMMUNITY): Payer: Self-pay

## 2016-08-26 ENCOUNTER — Inpatient Hospital Stay (HOSPITAL_COMMUNITY): Payer: Medicaid Other

## 2016-08-26 DIAGNOSIS — K219 Gastro-esophageal reflux disease without esophagitis: Secondary | ICD-10-CM | POA: Diagnosis present

## 2016-08-26 DIAGNOSIS — R739 Hyperglycemia, unspecified: Secondary | ICD-10-CM | POA: Diagnosis present

## 2016-08-26 DIAGNOSIS — I4581 Long QT syndrome: Secondary | ICD-10-CM | POA: Diagnosis present

## 2016-08-26 DIAGNOSIS — B182 Chronic viral hepatitis C: Secondary | ICD-10-CM | POA: Diagnosis present

## 2016-08-26 DIAGNOSIS — I468 Cardiac arrest due to other underlying condition: Secondary | ICD-10-CM | POA: Diagnosis present

## 2016-08-26 DIAGNOSIS — I959 Hypotension, unspecified: Secondary | ICD-10-CM | POA: Diagnosis not present

## 2016-08-26 DIAGNOSIS — Z801 Family history of malignant neoplasm of trachea, bronchus and lung: Secondary | ICD-10-CM

## 2016-08-26 DIAGNOSIS — N189 Chronic kidney disease, unspecified: Secondary | ICD-10-CM | POA: Diagnosis present

## 2016-08-26 DIAGNOSIS — R402212 Coma scale, best verbal response, none, at arrival to emergency department: Secondary | ICD-10-CM | POA: Diagnosis present

## 2016-08-26 DIAGNOSIS — D649 Anemia, unspecified: Secondary | ICD-10-CM

## 2016-08-26 DIAGNOSIS — F17210 Nicotine dependence, cigarettes, uncomplicated: Secondary | ICD-10-CM | POA: Diagnosis present

## 2016-08-26 DIAGNOSIS — Z66 Do not resuscitate: Secondary | ICD-10-CM | POA: Diagnosis not present

## 2016-08-26 DIAGNOSIS — R402112 Coma scale, eyes open, never, at arrival to emergency department: Secondary | ICD-10-CM | POA: Diagnosis present

## 2016-08-26 DIAGNOSIS — Z0189 Encounter for other specified special examinations: Secondary | ICD-10-CM

## 2016-08-26 DIAGNOSIS — Y92002 Bathroom of unspecified non-institutional (private) residence single-family (private) house as the place of occurrence of the external cause: Secondary | ICD-10-CM | POA: Diagnosis not present

## 2016-08-26 DIAGNOSIS — G936 Cerebral edema: Secondary | ICD-10-CM

## 2016-08-26 DIAGNOSIS — G931 Anoxic brain damage, not elsewhere classified: Secondary | ICD-10-CM | POA: Diagnosis present

## 2016-08-26 DIAGNOSIS — T401X1A Poisoning by heroin, accidental (unintentional), initial encounter: Secondary | ICD-10-CM | POA: Diagnosis present

## 2016-08-26 DIAGNOSIS — E875 Hyperkalemia: Secondary | ICD-10-CM | POA: Diagnosis present

## 2016-08-26 DIAGNOSIS — Z90710 Acquired absence of both cervix and uterus: Secondary | ICD-10-CM

## 2016-08-26 DIAGNOSIS — J449 Chronic obstructive pulmonary disease, unspecified: Secondary | ICD-10-CM | POA: Diagnosis present

## 2016-08-26 DIAGNOSIS — J9601 Acute respiratory failure with hypoxia: Secondary | ICD-10-CM | POA: Diagnosis present

## 2016-08-26 DIAGNOSIS — E872 Acidosis, unspecified: Secondary | ICD-10-CM

## 2016-08-26 DIAGNOSIS — D6489 Other specified anemias: Secondary | ICD-10-CM | POA: Diagnosis present

## 2016-08-26 DIAGNOSIS — G9382 Brain death: Secondary | ICD-10-CM | POA: Diagnosis not present

## 2016-08-26 DIAGNOSIS — N179 Acute kidney failure, unspecified: Secondary | ICD-10-CM | POA: Diagnosis present

## 2016-08-26 DIAGNOSIS — R402312 Coma scale, best motor response, none, at arrival to emergency department: Secondary | ICD-10-CM | POA: Diagnosis present

## 2016-08-26 DIAGNOSIS — R197 Diarrhea, unspecified: Secondary | ICD-10-CM | POA: Diagnosis present

## 2016-08-26 DIAGNOSIS — R509 Fever, unspecified: Secondary | ICD-10-CM | POA: Diagnosis not present

## 2016-08-26 DIAGNOSIS — F1110 Opioid abuse, uncomplicated: Secondary | ICD-10-CM | POA: Diagnosis present

## 2016-08-26 DIAGNOSIS — Z800 Family history of malignant neoplasm of digestive organs: Secondary | ICD-10-CM

## 2016-08-26 DIAGNOSIS — G935 Compression of brain: Secondary | ICD-10-CM | POA: Diagnosis not present

## 2016-08-26 DIAGNOSIS — Z8049 Family history of malignant neoplasm of other genital organs: Secondary | ICD-10-CM

## 2016-08-26 DIAGNOSIS — Z515 Encounter for palliative care: Secondary | ICD-10-CM | POA: Diagnosis not present

## 2016-08-26 DIAGNOSIS — I469 Cardiac arrest, cause unspecified: Secondary | ICD-10-CM | POA: Diagnosis present

## 2016-08-26 DIAGNOSIS — I129 Hypertensive chronic kidney disease with stage 1 through stage 4 chronic kidney disease, or unspecified chronic kidney disease: Secondary | ICD-10-CM | POA: Diagnosis present

## 2016-08-26 DIAGNOSIS — Z888 Allergy status to other drugs, medicaments and biological substances status: Secondary | ICD-10-CM

## 2016-08-26 LAB — RAPID URINE DRUG SCREEN, HOSP PERFORMED
AMPHETAMINES: NOT DETECTED
BARBITURATES: NOT DETECTED
BENZODIAZEPINES: POSITIVE — AB
Cocaine: NOT DETECTED
OPIATES: POSITIVE — AB
Tetrahydrocannabinol: NOT DETECTED

## 2016-08-26 LAB — URINALYSIS, ROUTINE W REFLEX MICROSCOPIC
Bilirubin Urine: NEGATIVE
Glucose, UA: 50 mg/dL — AB
Ketones, ur: NEGATIVE mg/dL
Leukocytes, UA: NEGATIVE
Nitrite: NEGATIVE
PROTEIN: 30 mg/dL — AB
SPECIFIC GRAVITY, URINE: 1.009 (ref 1.005–1.030)
Squamous Epithelial / HPF: NONE SEEN
pH: 6 (ref 5.0–8.0)

## 2016-08-26 LAB — CBC WITH DIFFERENTIAL/PLATELET
BASOS PCT: 0 %
Basophils Absolute: 0 10*3/uL (ref 0.0–0.1)
EOS ABS: 0.1 10*3/uL (ref 0.0–0.7)
Eosinophils Relative: 1 %
HCT: 35.3 % — ABNORMAL LOW (ref 36.0–46.0)
Hemoglobin: 10.9 g/dL — ABNORMAL LOW (ref 12.0–15.0)
LYMPHS ABS: 5.2 10*3/uL — AB (ref 0.7–4.0)
Lymphocytes Relative: 55 %
MCH: 29.1 pg (ref 26.0–34.0)
MCHC: 30.9 g/dL (ref 30.0–36.0)
MCV: 94.4 fL (ref 78.0–100.0)
MONO ABS: 0.3 10*3/uL (ref 0.1–1.0)
MONOS PCT: 3 %
Neutro Abs: 3.9 10*3/uL (ref 1.7–7.7)
Neutrophils Relative %: 41 %
Platelets: 178 10*3/uL (ref 150–400)
RBC: 3.74 MIL/uL — ABNORMAL LOW (ref 3.87–5.11)
RDW: 13.5 % (ref 11.5–15.5)
WBC: 9.5 10*3/uL (ref 4.0–10.5)

## 2016-08-26 LAB — I-STAT ARTERIAL BLOOD GAS, ED
ACID-BASE DEFICIT: 14 mmol/L — AB (ref 0.0–2.0)
BICARBONATE: 17.7 mmol/L — AB (ref 20.0–28.0)
O2 Saturation: 93 %
PCO2 ART: 69.9 mmHg — AB (ref 32.0–48.0)
PH ART: 7.012 — AB (ref 7.350–7.450)
PO2 ART: 100 mmHg (ref 83.0–108.0)
Patient temperature: 98.5
TCO2: 20 mmol/L (ref 0–100)

## 2016-08-26 LAB — COMPREHENSIVE METABOLIC PANEL
ALBUMIN: 2.4 g/dL — AB (ref 3.5–5.0)
ALK PHOS: 70 U/L (ref 38–126)
ALT: 32 U/L (ref 14–54)
AST: 96 U/L — AB (ref 15–41)
Anion gap: 15 (ref 5–15)
BUN: 16 mg/dL (ref 6–20)
CALCIUM: 7.8 mg/dL — AB (ref 8.9–10.3)
CO2: 16 mmol/L — AB (ref 22–32)
CREATININE: 1.51 mg/dL — AB (ref 0.44–1.00)
Chloride: 108 mmol/L (ref 101–111)
GFR calc Af Amer: 42 mL/min — ABNORMAL LOW (ref >60)
GFR calc non Af Amer: 36 mL/min — ABNORMAL LOW (ref >60)
GLUCOSE: 225 mg/dL — AB (ref 65–99)
Potassium: 5.2 mmol/L — ABNORMAL HIGH (ref 3.5–5.1)
SODIUM: 139 mmol/L (ref 135–145)
Total Bilirubin: 0.5 mg/dL (ref 0.3–1.2)
Total Protein: 4.5 g/dL — ABNORMAL LOW (ref 6.5–8.1)

## 2016-08-26 LAB — I-STAT TROPONIN, ED: Troponin i, poc: 0 ng/mL (ref 0.00–0.08)

## 2016-08-26 LAB — I-STAT CHEM 8, ED
BUN: 19 mg/dL (ref 6–20)
CHLORIDE: 105 mmol/L (ref 101–111)
Calcium, Ion: 1.01 mmol/L — ABNORMAL LOW (ref 1.15–1.40)
Creatinine, Ser: 1.3 mg/dL — ABNORMAL HIGH (ref 0.44–1.00)
GLUCOSE: 215 mg/dL — AB (ref 65–99)
HEMATOCRIT: 31 % — AB (ref 36.0–46.0)
HEMOGLOBIN: 10.5 g/dL — AB (ref 12.0–15.0)
POTASSIUM: 4.9 mmol/L (ref 3.5–5.1)
Sodium: 138 mmol/L (ref 135–145)
TCO2: 19 mmol/L (ref 0–100)

## 2016-08-26 LAB — I-STAT CG4 LACTIC ACID, ED
Lactic Acid, Venous: 9.74 mmol/L (ref 0.5–1.9)
Lactic Acid, Venous: 4.76 mmol/L (ref 0.5–1.9)

## 2016-08-26 LAB — SALICYLATE LEVEL: Salicylate Lvl: <7 mg/dL (ref 2.8–30.0)

## 2016-08-26 LAB — TROPONIN I

## 2016-08-26 LAB — ETHANOL

## 2016-08-26 LAB — MRSA PCR SCREENING: MRSA by PCR: NEGATIVE

## 2016-08-26 LAB — ACETAMINOPHEN LEVEL: Acetaminophen (Tylenol), Serum: <10 ug/mL — ABNORMAL LOW (ref 10–30)

## 2016-08-26 MED ORDER — SODIUM CHLORIDE 0.9 % IV SOLN: 250.0000 mL | INTRAVENOUS | Status: DC | PRN

## 2016-08-26 MED ORDER — SODIUM CHLORIDE 0.9 % IV BOLUS (SEPSIS)
1000.0000 mL | Freq: Once | INTRAVENOUS | Status: AC
  Administered 2016-08-26: 1000 mL via INTRAVENOUS

## 2016-08-26 MED ORDER — HYDRALAZINE HCL 20 MG/ML IJ SOLN
10.0000 mg | INTRAMUSCULAR | Status: DC | PRN
  Administered 2016-08-26: 10 mg via INTRAVENOUS
  Filled 2016-08-26: qty 1

## 2016-08-26 MED ORDER — FENTANYL CITRATE (PF) 100 MCG/2ML IJ SOLN
50.0000 ug | INTRAMUSCULAR | Status: DC | PRN
Start: 2016-08-26 — End: 2016-08-28

## 2016-08-26 MED ORDER — LABETALOL HCL 5 MG/ML IV SOLN
20.0000 mg | Freq: Once | INTRAVENOUS | Status: AC
  Administered 2016-08-26: 20 mg via INTRAVENOUS

## 2016-08-26 MED ORDER — NICARDIPINE HCL IN NACL 20-0.86 MG/200ML-% IV SOLN
3.0000 mg/h | INTRAVENOUS | Status: DC
  Filled 2016-08-26: qty 200

## 2016-08-26 MED ORDER — HEPARIN SODIUM (PORCINE) 5000 UNIT/ML IJ SOLN
5000.0000 [IU] | Freq: Three times a day (TID) | INTRAMUSCULAR | Status: DC
  Filled 2016-08-26: qty 1

## 2016-08-26 MED ORDER — PANTOPRAZOLE SODIUM 40 MG IV SOLR
40.0000 mg | INTRAVENOUS | Status: DC
  Administered 2016-08-27 (×2): 40 mg via INTRAVENOUS
  Filled 2016-08-26 (×2): qty 40

## 2016-08-26 MED ORDER — SODIUM CHLORIDE 0.9 % IV SOLN
0.0000 ug/min | INTRAVENOUS | Status: DC
  Administered 2016-08-26: 20 ug/min via INTRAVENOUS
  Administered 2016-08-27: 60 ug/min via INTRAVENOUS
  Filled 2016-08-26 (×3): qty 1

## 2016-08-26 MED ORDER — LABETALOL HCL 5 MG/ML IV SOLN
INTRAVENOUS | Status: AC
  Filled 2016-08-26: qty 4

## 2016-08-26 NOTE — Progress Notes (Addendum)
Pt arrived to [facility] with elevated bp ED RN paged CCM to acquire anti hypertensive medication. provider to enter new orders.  1029 pt bp decreased. paged 1035 MD sommer returned page. New meds entered by md  1050 DNR order reviewed,  1120 sisters at bedside

## 2016-08-26 NOTE — ED Notes (Signed)
Dr Hyacinth MeekerMiller given a copy of lactic acid results 9.74 and Chem 8 results

## 2016-08-26 NOTE — ED Provider Notes (Signed)
The patient is a 61 year old female, she arrives unresponsive and intubated after she was found unresponsive at home in the bathroom. Nobody had seen the patient for approximately 45 minutes after she went into the bathroom. She is known to have a history of renal chronic kidney disease and had acute on chronic kidney injury and was seen in the emergency department for more 27th. We are unaware of any interval changes in the patient's history. When paramedics arrived on the scene the report was that she was in cardiac arrest, a King airway was placed, the patient was defibrillated, she had 2 doses of epinephrine after that and had return of spontaneous circulation.  On arrival to the hospital the patient has weak peripheral pulses, she has a heart rate of approximately 70 bpm, her EKG shows a prolonged QT but no other significant acute or pathologic findings. The patient is totally unresponsive and has equal nonreactive 4 mm pupils. There is minimal if any edema to the lower extremities and a soft abdomen. She is being adequately ventilated with an endotracheal tube. She has good peripheral IV access.  See resident note for additional family - some hx of drug use in the past - long hours - feeling overwhelmed, pt unable to give any information  ICU team aware - pt will go to ICU Note - pt has had decreased Hgb since last check Has not had recurrent cardiac arrest since arrival Has been hypertensive with time and needed higher medicines.  CRITICAL CARE Performed by: Vida RollerBrian D Gwynne Kemnitz Total critical care time: 35 minutes Critical care time was exclusive of separately billable procedures and treating other patients. Critical care was necessary to treat or prevent imminent or life-threatening deterioration. Critical care was time spent personally by me on the following activities: development of treatment plan with patient and/or surrogate as well as nursing, discussions with consultants, evaluation of  patient's response to treatment, examination of patient, obtaining history from patient or surrogate, ordering and performing treatments and interventions, ordering and review of laboratory studies, ordering and review of radiographic studies, pulse oximetry and re-evaluation of patient's condition.   Medical screening examination/treatment/procedure(s) were conducted as a shared visit with non-physician practitioner(s) and myself.  I personally evaluated the patient during the encounter.  Clinical Impression:   Final diagnoses:  Cardiac arrest (HCC)  Lactic acidosis  Anemia, unspecified type  Cerebral edema (HCC)         Eber HongBrian Avry Roedl, MD 08/27/16 1047

## 2016-08-26 NOTE — ED Notes (Signed)
Consulting MD aware of pts BP. No orders received.

## 2016-08-26 NOTE — Progress Notes (Addendum)
eLink Physician-Brief Progress Note Patient Name: Jody HaradaLinda Schreffler DOB: 06/11/1956 MRN: [id number]   Date of Service  08/21/2016  HPI/Events of Note  Multiple issues: 1. Watery diarrhea - request for Flexiseal 3. Request for pain medication 3. Hypertension - SBP = 250, and 4. Concern for 6.5 ETT. Unable to pass Ballard Catheter.   eICU Interventions  Will order: 1. Place Flexiseal. 2. Try to get Pediatric Ballard.  3. Fentanyl 50-100 mcg IV Q 1 hour PRN.  4. Nicardipine IV infusion.      Intervention Category Major Interventions: Hypertension - evaluation and management  Loriene Taunton Eugene 09/14/2016, 10:19 PM

## 2016-08-26 NOTE — ED Notes (Signed)
Mini lab to send dark green top to main lab

## 2016-08-26 NOTE — ED Notes (Signed)
Dr Hyacinth MeekerMiller given a copy of lactic acid results 4.76

## 2016-08-26 NOTE — ED Provider Notes (Signed)
[facility]-EMER[facility]ENCY DEPT Provider Note   CSN: [id number] Arrival date & time: 2016/09/24  1716     History   Chief Complaint Chief Complaint  Patient presents with  . Cardiac Arrest    HPI Jody Alvarez is a 61 y.o. female presents via EMS for post-arrest. Per family, patient came home from work and stated she was going to lay down for a nap. She was found on her knees in the restroom 45 minutes later, cyanotic and pulseless. Family initiated CPR. Fire department placed AED and shocked once. On EMS arrival, pt was asystole, administered two rounds of epinephrine with return of pulses. Intubated in field with 6.5 ETT. Pt also received 800 cc NS en route. Per family, patient has a hx of heroin abuse and may have started using again recently. No signs of drug paraphernalia at site or on patient. Recently evaluated in ED with AKI and hypotension 2/2 multiple anti-hypertensive medications.  The history is provided by the EMS personnel and medical records.  Cardiac Arrest  Witnessed by:  Not witnessed Incident location:  Home Time since incident:  1 hour Time before BLS initiated:  > 5 minutes Condition upon EMS arrival:  Unresponsive and apneic Pulse:  Absent Treatments prior to arrival:  ACLS protocol, AED discharged, intubation and vascular access Medications given prior to ED:  Epinephrine Number of shocks delivered:  1 Rhythm after defibrillation:  Asystole IV access type:  Peripheral Airway:  Intubation prior to arrival Rhythm on admission to ED:  Normal sinus Risk factors: drug overdose (possible)   Risk factors: no head injury     Past Medical History:  Diagnosis Date  . Anxiety 2008   lost 4 close family members in 4 yrs, "doesn't like to leave house"  . Anxiety    plans to return to [facility] for counseling, has been in past  . Chronic hepatitis C (HCC)   . COPD (chronic obstructive pulmonary disease) (HCC)   . GERD (gastroesophageal reflux disease)   . Hypertension      Patient Active Problem List   Diagnosis Date Noted  . Cardiac arrest (HCC) 2016/09/24  . Chronic obstructive pulmonary disease (HCC) 05/29/2016  . Blood in stool   . Cephalalgia 01/10/2016  . Faintness 01/10/2016  . Ectatic aorta (HCC) 12/07/2015  . Abdominal pain 11/23/2015  . Chronic hepatitis C without hepatic coma (HCC) 11/01/2015  . Cigarette nicotine dependence with nicotine-induced disorder 11/01/2015  . Fecal occult blood test positive 11/01/2015  . Cough 10/04/2015  . Diarrhea 10/04/2015  . History of hepatitis 10/04/2015  . Abnormal mammogram 10/04/2015  . CKD (chronic kidney disease) 10/04/2015  . Essential hypertension, benign 09/15/2015  . Cigarette nicotine dependence without complication 09/15/2015  . Anxiety disorder 09/15/2015    Past Surgical History:  Procedure Laterality Date  . BIOPSY  03/21/2016   Procedure: BIOPSY;  Surgeon: West Bali, MD;  Location: [facility];  Service: Endoscopy;;  gastric   . COLONOSCOPY WITH PROPOFOL N/A 03/21/2016   Procedure: COLONOSCOPY WITH PROPOFOL;  Surgeon: West Bali, MD;  Location: [facility];  Service: Endoscopy;  Laterality: N/A;  115  . DILATION AND CURETTAGE OF UTERUS    . ESOPHAGOGASTRODUODENOSCOPY (EGD) WITH PROPOFOL N/A 03/21/2016   Procedure: ESOPHAGOGASTRODUODENOSCOPY (EGD) WITH PROPOFOL;  Surgeon: West Bali, MD;  Location: [facility];  Service: Endoscopy;  Laterality: N/A;  . RECTOCELE REPAIR    . VAGINAL HYSTERECTOMY     partial    OB History  No data available       Home Medications    Prior to Admission medications   Medication Sig Start Date End Date Taking? Authorizing Provider  albuterol (PROVENTIL HFA;VENTOLIN HFA) 108 (90 Base) MCG/ACT inhaler Inhale 2 puffs into the lungs every 6 (six) hours as needed for wheezing or shortness of breath. 11/02/15  Yes Jacquelin Hawking, PA-C  cloNIDine (CATAPRES) 0.2 MG tablet Take 1 tablet (0.2 mg total) by mouth 2 (two) times daily.  01/31/16  Yes Laqueta Linden, MD  hydrochlorothiazide (HYDRODIURIL) 25 MG tablet TAKE ONE TABLET BY MOUTH ONCE DAILY 03/13/16  Yes Jacquelin Hawking, PA-C  ibuprofen (ADVIL,MOTRIN) 800 MG tablet Take 800 mg by mouth every 12 (twelve) hours as needed for headache or moderate pain.    Yes Historical Provider, MD  lisinopril (PRINIVIL,ZESTRIL) 20 MG tablet Take 2 tablets (40 mg total) by mouth daily. 01/03/16  Yes Jacquelin Hawking, PA-C  metoprolol (LOPRESSOR) 50 MG tablet Take 1 tablet (50 mg total) by mouth 2 (two) times daily. 04/03/16  Yes Jacquelin Hawking, PA-C  omeprazole (PRILOSEC) 20 MG capsule 1 PO 30 mins prior to breakfast and supper Patient taking differently: Take 20 mg by mouth 2 (two) times daily before a meal.  04/03/16  Yes Jacquelin Hawking, PA-C  Tetrahydroz-Glyc-Hyprom-PEG (VISINE MAXIMUM REDNESS RELIEF) 0.05-0.2-0.36-1 % SOLN Apply 1 drop to eye daily as needed (for redness relief).   Yes Historical Provider, MD    Family History Family History  Problem Relation Age of Onset  . Diabetes Mother   . Cancer Mother     uterine  . Cancer Father     unknown primary  . Cancer Sister     pancreatic  . Cancer Sister     cervical  . Cancer Sister     cervical  . Cancer Maternal Grandfather     lung  . Colon cancer Neg Hx   . Liver disease Neg Hx     Social History Social History  Substance Use Topics  . Smoking status: Current Every Day Smoker    Packs/day: 1.00    Years: 50.00    Types: Cigarettes    Start date: 01/30/1969  . Smokeless tobacco: Never Used     Comment: interested in Chantix  . Alcohol use No     Allergies   Quetiapine; Trazodone and nefazodone; Flexeril [cyclobenzaprine]; and Phenergan [promethazine hcl]   Review of Systems Review of Systems  Unable to perform ROS: Patient unresponsive  Skin: Negative for color change and rash.     Physical Exam Updated Vital Signs BP 103/88   Pulse (!) 52   Temp 97.4 F (36.3 C) (Oral)   Resp (!) 32    Wt 64.9 kg   SpO2 100%   BMI 23.09 kg/m   Physical Exam  Constitutional: She appears well-developed and well-nourished. She is intubated.  HENT:  Head: Normocephalic and atraumatic.  Eyes: Conjunctivae are normal. Right pupil is reactive. Left pupil is reactive.  Pupils 5 mm, equal, unreactive  Neck: Neck supple.  Cardiovascular: Normal rate and regular rhythm.   Pulmonary/Chest: Breath sounds normal. She is intubated.  Abdominal: Soft.  Musculoskeletal: She exhibits no edema.  Neurological: She is unresponsive. GCS eye subscore is 1. GCS verbal subscore is 1. GCS motor subscore is 1.  Skin: Skin is warm and dry.  Nursing note and vitals reviewed.    ED Treatments / Results  Labs (all labs ordered are listed, but only abnormal results are displayed) Labs Reviewed  COMPREHENSIVE METABOLIC PANEL - Abnormal; Notable for the following:       Result Value   Potassium 5.2 (*)    CO2 16 (*)    Glucose, Bld 225 (*)    Creatinine, Ser 1.51 (*)    Calcium 7.8 (*)    Total Protein 4.5 (*)    Albumin 2.4 (*)    AST 96 (*)    GFR calc non Af Amer 36 (*)    GFR calc Af Amer 42 (*)    All other components within normal limits  CBC WITH DIFFERENTIAL/PLATELET - Abnormal; Notable for the following:    RBC 3.74 (*)    Hemoglobin 10.9 (*)    HCT 35.3 (*)    Lymphs Abs 5.2 (*)    All other components within normal limits  URINALYSIS, ROUTINE W REFLEX MICROSCOPIC - Abnormal; Notable for the following:    APPearance HAZY (*)    Glucose, UA 50 (*)    Hgb urine dipstick SMALL (*)    Protein, ur 30 (*)    Bacteria, UA RARE (*)    All other components within normal limits  RAPID URINE DRUG SCREEN, HOSP PERFORMED - Abnormal; Notable for the following:    Opiates POSITIVE (*)    Benzodiazepines POSITIVE (*)    All other components within normal limits  ACETAMINOPHEN LEVEL - Abnormal; Notable for the following:    Acetaminophen (Tylenol), Serum <10 (*)    All other components within  normal limits  I-STAT CG4 LACTIC ACID, ED - Abnormal; Notable for the following:    Lactic Acid, Venous 9.74 (*)    All other components within normal limits  I-STAT CHEM 8, ED - Abnormal; Notable for the following:    Creatinine, Ser 1.30 (*)    Glucose, Bld 215 (*)    Calcium, Ion 1.01 (*)    Hemoglobin 10.5 (*)    HCT 31.0 (*)    All other components within normal limits  I-STAT ARTERIAL BLOOD GAS, ED - Abnormal; Notable for the following:    pH, Arterial 7.012 (*)    pCO2 arterial 69.9 (*)    Bicarbonate 17.7 (*)    Acid-base deficit 14.0 (*)    All other components within normal limits  I-STAT CG4 LACTIC ACID, ED - Abnormal; Notable for the following:    Lactic Acid, Venous 4.76 (*)    All other components within normal limits  MRSA PCR SCREENING  CULTURE, BLOOD (ROUTINE X 2)  CULTURE, BLOOD (ROUTINE X 2)  TROPONIN I  ETHANOL  SALICYLATE LEVEL  I-STAT TROPOININ, ED    EKG  EKG Interpretation  Date/Time:  Saturday August 26 2016 17:19:21 EST Ventricular Rate:  64 PR Interval:    QRS Duration: 105 QT Interval:  512 QTC Calculation: 529 R Axis:   -40 Text Interpretation:  Sinus rhythm Left anterior fascicular block Low voltage, precordial leads Abnormal R-wave progression, early transition Prolonged QT interval QT has lengthened Abnormal ekg Confirmed by MILLER  MD, BRIAN ([id number]) on 08/24/2016 5:30:20 PM       Radiology Ct Head Wo Contrast  Result Date: 08/18/2016 CLINICAL DATA:  Unresponsive EXAM: CT HEAD WITHOUT CONTRAST TECHNIQUE: Contiguous axial images were obtained from the base of the skull through the vertex without intravenous contrast. COMPARISON:  None. FINDINGS: Brain: There is sulcal effacement, narrowing of the ventricles, and subtle loss of the gray-white differentiation. This appearance raises concern for diffuse cerebral edema. No evidence of parenchymal hemorrhage. No extra axial fluid collection.  No CT findings for acute infarction. Crowding of the  basilar cisterns (series 7/ image 17). No frank herniation. Subcortical hypodensities in the bilateral frontal lobes (series 7/ image 25), likely reflecting small vessel ischemic changes. Vascular: No hyperdense vessel or unexpected calcification. Skull: Normal. Negative for fracture or focal lesion. Sinuses/Orbits: Mild partial opacification of the bilateral ethmoid and right sphenoid sinuses. Mastoid air cells are clear. Other: None. IMPRESSION: Findings worrisome for diffuse cerebral edema, possibly related to global hypoxic/anoxic event. Crowding of the basilar cisterns, without frank downward herniation at this time. These results were called by telephone at the time of interpretation on 08/30/2016 at 6:20 pm to Dr. Durenda Age, who verbally acknowledged these results. Electronically Signed   By: Charline Bills M.D.   On: 08/23/2016 18:25   Dg Chest Port 1 View  Result Date: 08/27/2016 CLINICAL DATA:  ETT placement EXAM: PORTABLE CHEST 1 VIEW COMPARISON:  01/03/2016 FINDINGS: Endotracheal tube terminates 5 cm above the carina. Small left pleural effusion. Associated left lower lobe opacity, likely atelectasis. Right lung is clear. No pneumothorax. The heart is normal in size. Defibrillator pads overlying the left hemithorax. IMPRESSION: Endotracheal tube terminates 5 cm above the carina. Small left pleural effusion. Associated left lower lobe opacity, likely atelectasis. Electronically Signed   By: Charline Bills M.D.   On: 09/12/2016 18:26   Procedures Procedures (including critical care time)  Medications Ordered in ED Medications  0.9 %  sodium chloride infusion (not administered)  heparin injection 5,000 Units (not administered)  hydrALAZINE (APRESOLINE) injection 10 mg (10 mg Intravenous Given 09/02/2016 2158)  fentaNYL (SUBLIMAZE) injection 50-100 mcg (not administered)  pantoprazole (PROTONIX) injection 40 mg (not administered)  phenylephrine (NEO-SYNEPHRINE) 10 mg in sodium  chloride 0.9 % 250 mL (0.04 mg/mL) infusion (20 mcg/min Intravenous New Bag/Given 08/19/2016 2251)  sodium chloride 0.9 % bolus 1,000 mL (0 mLs Intravenous Stopped 08/27/2016 1852)    And  sodium chloride 0.9 % bolus 1,000 mL (0 mLs Intravenous Stopped 08/24/2016 1852)  labetalol (NORMODYNE,TRANDATE) injection 20 mg (20 mg Intravenous Given 08/20/2016 1954)  sodium chloride 0.9 % bolus 1,000 mL (1,000 mLs Intravenous Given 08/18/2016 2250)     Initial Impression / Assessment and Plan / ED Course  I have reviewed the triage vital signs and the nursing notes.  Pertinent labs & imaging results that were available during my care of the patient were reviewed by me and considered in my medical decision making (see chart for details).    61 y.o. female presents as post-CPR for hypoxic respiratory and cardiac arrest. ROSC achieved PTA. On arrival, unresponsive, no spontaneous breathing, pupils dilated and unreactive. Bedside ultrasound show good cardiac activity, no pericardial effusion. EKG NSR, HR 64, no ischemic changes, prolonged QT. Given 2L NS bolus. Lactic acidosis 9.74. CT head shows diffuse cerebral edema. UDS +opiates and benzos. Initial SBP 90s, pt became hypertensive to 200s in Ed, given one time dose IV labetolol. Admitted to ICU, please see their note for further care.   Discussed with my attending physician, Dr Hyacinth Meeker.    Final Clinical Impressions(s) / ED Diagnoses   Final diagnoses:  Cardiac arrest (HCC)  Lactic acidosis  Anemia, unspecified type  Cerebral edema [facility])    New Prescriptions Current Discharge Medication List       Pablo Ledger, MD 08/27/2016 2354    Eber Hong, MD 08/27/16 1048

## 2016-08-26 NOTE — ED Triage Notes (Addendum)
Patient arrived by [facility] from home after being found unconscious by family after not being seen after going to bathroom for 45 minutes. Fire department placed AED and shocked x 1. EMS found asystole and administered 2 EPI with return of pulses. On arrival being bagged and apneic. Intubated pta with 6.5 ETT and RT and MD at bedside on patient arrival. Right EJ established pta. CBG 102. Patient had received 800 of NS prior to arrival

## 2016-08-26 NOTE — Progress Notes (Signed)
Patient transported to 4N 23 from ED TRAA, Report given to 4N RT.

## 2016-08-26 NOTE — Progress Notes (Deleted)
Patient transported from ED to 4N23 with no complications.

## 2016-08-26 NOTE — Progress Notes (Signed)
Encountered emotional family member coming for this pt at [facility], and provided emotional/spiritual support and prayer to her and another coming to meet her who'd already been in rm. Pt had gone to Cath Lab. Family members especially appreciated the prayer. Chaplain available for f/u.   08/27/2016 1800  Clinical Encounter Type  Visited With Family  Visit Type Initial;Psychological support;Spiritual support;Social support;ED  Referral From Chaplain  Spiritual Encounters  Spiritual Needs Prayer;Emotional  Stress Factors  Patient Stress Factors Health changes  Family Stress Factors Family relationships;Health changes;Loss of control   Jody Alvarez, [address]

## 2016-08-26 NOTE — ED Notes (Signed)
Critical care at bedside  

## 2016-08-26 NOTE — Progress Notes (Signed)
eLink Physician-Brief Progress Note Patient Name: Jody HaradaLinda Kuster DOB: 08/16/1955 MRN: [id number]   Date of Service  08/18/2016  HPI/Events of Note  Hypertension - BP = 249/134. Impending cerebral herniation? Will treat cautiously.   eICU Interventions  Will order:  1. Hydralazine 10 mg IV Q 4 hours PRN SBP > 170.     Intervention Category Major Interventions: Hypertension - evaluation and management  Makenzee Choudhry Eugene 09/08/2016, 9:43 PM

## 2016-08-26 NOTE — Progress Notes (Signed)
eLink Physician-Brief Progress Note Patient Name: Jody HaradaLinda Alvarez DOB: 01/24/1956 MRN: [id number]   Date of Service  09/16/2016  HPI/Events of Note  Hypotension - BP = 65/56. Likely related to cerebral herniation.   eICU Interventions  Will order: 1. D/C Nicardipine IV infusion. 2. Bolus with 0.9 NaCl 1 lter IV over 1 hour now.  3. Phenylepherine IV infusion. Titrate to MAP >= 65.     Intervention Category Major Interventions: Hypotension - evaluation and management  Sommer,Steven Eugene 08/30/2016, 10:44 PM

## 2016-08-27 DIAGNOSIS — I469 Cardiac arrest, cause unspecified: Secondary | ICD-10-CM

## 2016-08-27 LAB — BASIC METABOLIC PANEL
Anion gap: 9 (ref 5–15)
BUN: 20 mg/dL (ref 6–20)
CHLORIDE: 116 mmol/L — AB (ref 101–111)
CO2: 16 mmol/L — AB (ref 22–32)
Calcium: 7.7 mg/dL — ABNORMAL LOW (ref 8.9–10.3)
Creatinine, Ser: 1.74 mg/dL — ABNORMAL HIGH (ref 0.44–1.00)
GFR calc non Af Amer: 31 mL/min — ABNORMAL LOW (ref >60)
GFR, EST AFRICAN AMERICAN: 36 mL/min — AB (ref >60)
GLUCOSE: 161 mg/dL — AB (ref 65–99)
Potassium: 3.9 mmol/L (ref 3.5–5.1)
Sodium: 141 mmol/L (ref 135–145)

## 2016-08-27 LAB — LACTIC ACID, PLASMA
LACTIC ACID, VENOUS: 2.5 mmol/L — AB (ref 0.5–1.9)
Lactic Acid, Venous: 2.6 mmol/L (ref 0.5–1.9)

## 2016-08-27 MED ORDER — ACETAMINOPHEN 160 MG/5ML PO SOLN
650.0000 mg | Freq: Four times a day (QID) | ORAL | Status: DC | PRN
  Administered 2016-08-27: 650 mg
  Filled 2016-08-27: qty 20.3

## 2016-08-27 MED ORDER — CHLORHEXIDINE GLUCONATE 0.12% ORAL RINSE (MEDLINE KIT)
15.0000 mL | Freq: Two times a day (BID) | OROMUCOSAL | Status: DC
  Administered 2016-08-27 – 2016-08-28 (×3): 15 mL via OROMUCOSAL

## 2016-08-27 MED ORDER — ORAL CARE MOUTH RINSE
15.0000 mL | Freq: Four times a day (QID) | OROMUCOSAL | Status: DC
  Administered 2016-08-27 – 2016-08-28 (×4): 15 mL via OROMUCOSAL

## 2016-08-27 MED ORDER — SODIUM CHLORIDE 0.9 % IV SOLN: 250.0000 mL | INTRAVENOUS | Status: DC | PRN

## 2016-08-27 NOTE — H&P (Signed)
PULMONARY / CRITICAL CARE MEDICINE   Name: Jody Alvarez MRN: [id number] DOB: 1956-05-18    ADMISSION DATE:  09/08/2016  CHIEF COMPLAINT:  Cardiac Arrest  HISTORY OF PRESENT ILLNESS:   61 y/o woman with a history of chronic substance abuse (herion) who presents after being found by a friend in her bathroom, cyanotic and pulseless, about 45 minutes after last being seen well. She was taken to the ED and ROSC was obtained. A head CT demonstrated diffuse anoxic pattern of injury. She developed worsening hypertension and bradycardia. A family discussion was had to elected to make her comfort care pending the arrival of her son, Moise Boring (whom I spoke to).  PAST MEDICAL HISTORY :  She  has a past medical history of Anxiety (2008); Anxiety; Chronic hepatitis C (HCC); COPD (chronic obstructive pulmonary disease) (HCC); GERD (gastroesophageal reflux disease); and Hypertension.  PAST SURGICAL HISTORY: She  has a past surgical history that includes Rectocele repair; Vaginal hysterectomy; Dilation and curettage of uterus; Colonoscopy with propofol (N/A, 03/21/2016); Esophagogastroduodenoscopy (egd) with propofol (N/A, 03/21/2016); and biopsy (03/21/2016).  Allergies  Allergen Reactions  . Quetiapine Palpitations  . Trazodone And Nefazodone Anxiety and Palpitations  . Flexeril [Cyclobenzaprine] Other (See Comments)    Hallucinations   . Phenergan [Promethazine Hcl] Other (See Comments)    Hallucinations    No current facility-administered medications on file prior to encounter.    Current Outpatient Prescriptions on File Prior to Encounter  Medication Sig  . albuterol (PROVENTIL HFA;VENTOLIN HFA) 108 (90 Base) MCG/ACT inhaler Inhale 2 puffs into the lungs every 6 (six) hours as needed for wheezing or shortness of breath.  . cloNIDine (CATAPRES) 0.2 MG tablet Take 1 tablet (0.2 mg total) by mouth 2 (two) times daily.  . hydrochlorothiazide (HYDRODIURIL) 25 MG tablet TAKE ONE TABLET BY MOUTH ONCE DAILY   . ibuprofen (ADVIL,MOTRIN) 800 MG tablet Take 800 mg by mouth every 12 (twelve) hours as needed for headache or moderate pain.   Marland Kitchen lisinopril (PRINIVIL,ZESTRIL) 20 MG tablet Take 2 tablets (40 mg total) by mouth daily.  . metoprolol (LOPRESSOR) 50 MG tablet Take 1 tablet (50 mg total) by mouth 2 (two) times daily.  Marland Kitchen omeprazole (PRILOSEC) 20 MG capsule 1 PO 30 mins prior to breakfast and supper (Patient taking differently: Take 20 mg by mouth 2 (two) times daily before a meal. )  . Tetrahydroz-Glyc-Hyprom-PEG (VISINE MAXIMUM REDNESS RELIEF) 0.05-0.2-0.36-1 % SOLN Apply 1 drop to eye daily as needed (for redness relief).    FAMILY HISTORY:  Her indicated that her mother is deceased. She indicated that her father is deceased. She indicated that two of her three sisters are alive. She indicated that the status of her maternal grandfather is unknown. She indicated that the status of her neg hx is unknown.    SOCIAL HISTORY: She  reports that she has been smoking Cigarettes.  She started smoking about 47 years ago. She has a 50.00 pack-year smoking history. She has never used smokeless tobacco. She reports that she does not drink alcohol or use drugs.  VITAL SIGNS: BP 103/77   Pulse 67   Temp (!) 94.4 F (34.7 C) (Oral) Comment: applied bear hugger  Resp (!) 32   Wt 143 lb 1.3 oz (64.9 kg)   SpO2 100%   BMI 23.09 kg/m   HEMODYNAMICS:    VENTILATOR SETTINGS: Vent Mode: PRVC FiO2 (%):  [40 %-100 %] 40 % Set Rate:  [14 bmp-32 bmp] 32 bmp Vt Set:  [470 mL]  470 mL PEEP:  [5 cmH20] 5 cmH20 Plateau Pressure:  [14 cmH20-26 cmH20] 26 cmH20  INTAKE / OUTPUT: I/O last 3 completed shifts: In: 2000 [IV Piggyback:2000] Out: 550 [Urine:550]  PHYSICAL EXAMINATION: General:  Middle aged woman intubated. Neuro:  Pupils fixed and dilated, no crainal nerve reflexes HEENT: MMM Cardiovascular:  Bradycardic Lungs:  CTA Abdomen:  Soft Musculoskeletal:  Nondistended Skin:  No  rashes  LABS:  BMET  Recent Labs Lab 16-Jul-2016 1722 16-Jul-2016 1728  NA 139 138  K 5.2* 4.9  CL 108 105  CO2 16*  --   BUN 16 19  CREATININE 1.51* 1.30*  GLUCOSE 225* 215*    Electrolytes  Recent Labs Lab 16-Jul-2016 1722  CALCIUM 7.8*    CBC  Recent Labs Lab 16-Jul-2016 1722 16-Jul-2016 1728  WBC 9.5  --   HGB 10.9* 10.5*  HCT 35.3* 31.0*  PLT 178  --     Coag's No results for input(s): APTT, INR in the last 168 hours.  Sepsis Markers  Recent Labs Lab 16-Jul-2016 1728 16-Jul-2016 2021 08/27/16 0144  LATICACIDVEN 9.74* 4.76* 2.5*    ABG  Recent Labs Lab 16-Jul-2016 1744  PHART 7.012*  PCO2ART 69.9*  PO2ART 100.0    Liver Enzymes  Recent Labs Lab 16-Jul-2016 1722  AST 96*  ALT 32  ALKPHOS 70  BILITOT 0.5  ALBUMIN 2.4*    Cardiac Enzymes  Recent Labs Lab 16-Jul-2016 1722  TROPONINI <0.03    Glucose No results for input(s): GLUCAP in the last 168 hours.  Imaging Ct Head Wo Contrast  Result Date: 29-Dec-2016 CLINICAL DATA:  Unresponsive EXAM: CT HEAD WITHOUT CONTRAST TECHNIQUE: Contiguous axial images were obtained from the base of the skull through the vertex without intravenous contrast. COMPARISON:  None. FINDINGS: Brain: There is sulcal effacement, narrowing of the ventricles, and subtle loss of the gray-white differentiation. This appearance raises concern for diffuse cerebral edema. No evidence of parenchymal hemorrhage. No extra axial fluid collection. No CT findings for acute infarction. Crowding of the basilar cisterns (series 7/ image 17). No frank herniation. Subcortical hypodensities in the bilateral frontal lobes (series 7/ image 25), likely reflecting small vessel ischemic changes. Vascular: No hyperdense vessel or unexpected calcification. Skull: Normal. Negative for fracture or focal lesion. Sinuses/Orbits: Mild partial opacification of the bilateral ethmoid and right sphenoid sinuses. Mastoid air cells are clear. Other: None. IMPRESSION:  Findings worrisome for diffuse cerebral edema, possibly related to global hypoxic/anoxic event. Crowding of the basilar cisterns, without frank downward herniation at this time. These results were called by telephone at the time of interpretation on 29-Dec-2016 at 6:20 pm to Dr. Durenda AgeELIZABETH CAUDILL, who verbally acknowledged these results. Electronically Signed   By: Charline BillsSriyesh  Krishnan M.D.   On: 013-Jul-2018 18:25   Dg Chest Port 1 View  Result Date: 29-Dec-2016 CLINICAL DATA:  ETT placement EXAM: PORTABLE CHEST 1 VIEW COMPARISON:  01/03/2016 FINDINGS: Endotracheal tube terminates 5 cm above the carina. Small left pleural effusion. Associated left lower lobe opacity, likely atelectasis. Right lung is clear. No pneumothorax. The heart is normal in size. Defibrillator pads overlying the left hemithorax. IMPRESSION: Endotracheal tube terminates 5 cm above the carina. Small left pleural effusion. Associated left lower lobe opacity, likely atelectasis. Electronically Signed   By: Charline BillsSriyesh  Krishnan M.D.   On: 013-Jul-2018 18:26   Dg Abd Portable 1v  Result Date: 29-Dec-2016 CLINICAL DATA:  OG tube placement EXAM: PORTABLE ABDOMEN - 1 VIEW COMPARISON:  None. FINDINGS: An OG tube is identified with  tip overlying the distal stomach. The bowel gas pattern is unremarkable. IMPRESSION: OG tube tip overlying the distal stomach. Electronically Signed   By: Harmon Pier M.D.   On: 08/17/2016 23:57     ASSESSMENT / PLAN:  I had a long discussion with Ms. Zemaitis family and son, Moise Boring, (via speaker phone). I explained her poor prognosis and recommended that she be transitioned to comfort measures only. Her son is making his way from [place]. Plan for terminal extubation when he arrives.   CRITICAL CARE Performed by: Jamie Kato   Total critical care time: 80 minutes  Critical care time was exclusive of separately billable procedures and treating other patients.  Critical care was necessary to treat or prevent imminent  or life-threatening deterioration.  Critical care was time spent personally by me on the following activities: development of treatment plan with patient and/or surrogate as well as nursing, discussions with consultants, evaluation of patient's response to treatment, examination of patient, obtaining history from patient or surrogate, ordering and performing treatments and interventions, ordering and review of laboratory studies, ordering and review of radiographic studies, pulse oximetry and re-evaluation of patient's condition.  Jamie Kato, MD Pulmonary and Critical Care Medicine [facility] Pager: [phone]  08/27/2016, 4:23 AM

## 2016-08-27 NOTE — Progress Notes (Signed)
eLink Physician-Brief Progress Note Patient Name: Jody Alvarez DOB: 01/16/1956 MRN: [id number]   Date of Service  08/27/2016  HPI/Events of Note  Multiple issues, RN concerned about patient comfort, goals of care are to withdraw: 1) tachypnea, due to vent settings 2) fever 3) tachcardia Chart reviewed> comfort measures   eICU Interventions  1) apap for fever 2) fluids for tachycardia 3) decrease set rate on mechanical ventilation     Intervention Category Major Interventions: Intracranial hypertension - evaluation and management Intermediate Interventions: Infection - evaluation and management  Max FickleDouglas McQuaid 08/27/2016, 11:39 PM

## 2016-08-27 NOTE — Progress Notes (Signed)
PCCM Progress Note  Admission date: 09/06/2016 Referring provider: Dr. Clovis Riley, ER  CC: cardiac arrest  HPI: 61 yo female found in bathroom by friend unresponsive with presumed respiratory leading to cardiac arrest from heroin abuse.  UDS positive for opiates and benzo's.  ROSC after 45 minutes and CT head showed diffuse cerebral edema.  Subjective: Comatose.  Vital signs: BP (!) 130/93   Pulse 74   Temp 98.9 F (37.2 C) (Oral) Comment: removed bear hugger  Resp (!) 32   Wt 143 lb 1.3 oz (64.9 kg)   SpO2 100%   BMI 23.09 kg/m   Intake/output: I/O last 3 completed shifts: In: 2000 [IV Piggyback:2000] Out: 1225 [Urine:925; Emesis/NG output:300]  General: comatose Neuro: no corneal, no gag HEENT: pupils dilated, fixed Cardiac: regular Chest: b/l crackles Abd: soft, decreased bowel sounds Ext: no edema Skin: no rashes   CMP Latest Ref Rng & Units 08/27/2016 09/12/2016 09/07/2016  Glucose 65 - 99 mg/dL 161(W) 960(A) 540(J)  BUN 6 - 20 mg/dL 20 19 16   Creatinine 0.44 - 1.00 mg/dL 8.11(B) 1.47(W) 2.95(A)  Sodium 135 - 145 mmol/L 141 138 139  Potassium 3.5 - 5.1 mmol/L 3.9 4.9 5.2(H)  Chloride 101 - 111 mmol/L 116(H) 105 108  CO2 22 - 32 mmol/L 16(L) - 16(L)  Calcium 8.9 - 10.3 mg/dL 7.7(L) - 7.8(L)  Total Protein 6.5 - 8.1 g/dL - - 4.5(L)  Total Bilirubin 0.3 - 1.2 mg/dL - - 0.5  Alkaline Phos 38 - 126 U/L - - 70  AST 15 - 41 U/L - - 96(H)  ALT 14 - 54 U/L - - 32     CBC Latest Ref Rng & Units 09/08/2016 08/17/2016 08/15/2016  WBC 4.0 - 10.5 K/uL - 9.5 9.9  Hemoglobin 12.0 - 15.0 g/dL 10.5(L) 10.9(L) 15.5(H)  Hematocrit 36.0 - 46.0 % 31.0(L) 35.3(L) 45.7  Platelets 150 - 400 K/uL - 178 208     ABG    Component Value Date/Time   PHART 7.012 (LL) 08/20/2016 1744   PCO2ART 69.9 (HH) 09/09/2016 1744   PO2ART 100.0 08/30/2016 1744   HCO3 17.7 (L) 08/31/2016 1744   TCO2 20 08/29/2016 1744   ACIDBASEDEF 14.0 (H) 09/13/2016 1744   O2SAT 93.0 08/22/2016 1744      CBG (last 3)  No results for input(s): GLUCAP in the last 72 hours.   Imaging: Ct Head Wo Contrast  Result Date: 08/27/2016 CLINICAL DATA:  Unresponsive EXAM: CT HEAD WITHOUT CONTRAST TECHNIQUE: Contiguous axial images were obtained from the base of the skull through the vertex without intravenous contrast. COMPARISON:  None. FINDINGS: Brain: There is sulcal effacement, narrowing of the ventricles, and subtle loss of the gray-white differentiation. This appearance raises concern for diffuse cerebral edema. No evidence of parenchymal hemorrhage. No extra axial fluid collection. No CT findings for acute infarction. Crowding of the basilar cisterns (series 7/ image 17). No frank herniation. Subcortical hypodensities in the bilateral frontal lobes (series 7/ image 25), likely reflecting small vessel ischemic changes. Vascular: No hyperdense vessel or unexpected calcification. Skull: Normal. Negative for fracture or focal lesion. Sinuses/Orbits: Mild partial opacification of the bilateral ethmoid and right sphenoid sinuses. Mastoid air cells are clear. Other: None. IMPRESSION: Findings worrisome for diffuse cerebral edema, possibly related to global hypoxic/anoxic event. Crowding of the basilar cisterns, without frank downward herniation at this time. These results were called by telephone at the time of interpretation on 09/04/2016 at 6:20 pm to Dr. Durenda Age, who verbally acknowledged these results. Electronically  Signed   By: Charline BillsSriyesh  Krishnan M.D.   On: May 10, 2017 18:25   Dg Chest Port 1 View  Result Date: 2016-10-06 CLINICAL DATA:  ETT placement EXAM: PORTABLE CHEST 1 VIEW COMPARISON:  01/03/2016 FINDINGS: Endotracheal tube terminates 5 cm above the carina. Small left pleural effusion. Associated left lower lobe opacity, likely atelectasis. Right lung is clear. No pneumothorax. The heart is normal in size. Defibrillator pads overlying the left hemithorax. IMPRESSION: Endotracheal tube  terminates 5 cm above the carina. Small left pleural effusion. Associated left lower lobe opacity, likely atelectasis. Electronically Signed   By: Charline BillsSriyesh  Krishnan M.D.   On: May 10, 2017 18:26   Dg Abd Portable 1v  Result Date: 2016-10-06 CLINICAL DATA:  OG tube placement EXAM: PORTABLE ABDOMEN - 1 VIEW COMPARISON:  None. FINDINGS: An OG tube is identified with tip overlying the distal stomach. The bowel gas pattern is unremarkable. IMPRESSION: OG tube tip overlying the distal stomach. Electronically Signed   By: Harmon PierJeffrey  Hu M.D.   On: May 10, 2017 23:57    Assessment: Respiratory arrest leading to cardiac arrest 2nd to heroin overdose Anoxic encephalopathy Acute hypoxic respiratory failure Metabolic acidosis Lactic acidosis Hyperkalemia Acute renal failure Hyperglycemia Anemia of critical illness  Plan: DNR Will need to d/w family gravity of neurologic findings and discuss vent withdrawal >> she likely meets brain death criteria, but too early to declare  Coralyn HellingVineet Artesia Berkey, MD [facility] 08/27/2016, 8:23 AM Pager:  [phone] After 3pm call: [phone]

## 2016-08-28 DIAGNOSIS — G936 Cerebral edema: Secondary | ICD-10-CM

## 2016-08-28 DIAGNOSIS — D649 Anemia, unspecified: Secondary | ICD-10-CM

## 2016-08-28 MED ORDER — SODIUM CHLORIDE 0.9 % IV SOLN
10.0000 mg/h | INTRAVENOUS | Status: DC
  Administered 2016-08-28: 5 mg/h via INTRAVENOUS
  Filled 2016-08-28: qty 10

## 2016-08-28 MED ORDER — MORPHINE BOLUS VIA INFUSION
5.0000 mg | INTRAVENOUS | Status: DC | PRN
  Administered 2016-08-28: 5 mg via INTRAVENOUS
  Filled 2016-08-28: qty 20

## 2016-08-29 ENCOUNTER — Telehealth: Payer: Self-pay

## 2016-08-29 NOTE — Telephone Encounter (Signed)
On 08/29/16 I received a death certificate from [facility] (faxed). The death certificate is for cremation. The patient is a patient of Doctor Tyson AliasFeinstein. [facility] this pm for signature.  On 08/30/16 I received the death certificate back from Doctor Tyson AliasFeinstein. I got the death certificate ready and faxed the death certificate to the funeral home per the funeral home request.

## 2016-08-31 LAB — CULTURE, BLOOD (ROUTINE X 2)
CULTURE: NO GROWTH
CULTURE: NO GROWTH

## 2016-09-04 ENCOUNTER — Ambulatory Visit: Payer: Self-pay | Admitting: Physician Assistant

## 2016-09-07 ENCOUNTER — Ambulatory Visit: Payer: Self-pay | Admitting: Gastroenterology

## 2016-09-11 ENCOUNTER — Telehealth: Payer: Self-pay

## 2016-09-11 NOTE — Telephone Encounter (Signed)
On 09/11/16 I received a [facility] from [facility] (original[facility] The death certificate is for cremation. The patient is a patient of Doctor Tyson AliasFeinstein. [facility] this pm for signature.  On 09/13/16 I received the death certificate back from Doctor Vassie LollAlva. I got the death certificate ready and called the funeral home to let them know the death certificate was mailed to vital records per their request.

## 2016-09-17 NOTE — Progress Notes (Signed)
245 of 250 mL bag of morphine wasted in sink by two RNs, Lexi and Melissa.

## 2016-09-17 NOTE — Progress Notes (Signed)
eLink Physician-Brief Progress Note Patient Name: Jody Alvarez DOB: [birth year] MRN: [id number]   Date of Service  08/19/2016  HPI/Events of Note  Hypotension Severe brain injury, plan to withdraw DNR  eICU Interventions  Continue current management     Intervention Category Major Interventions: Hypotension - evaluation and management  Max Fickle 09/04/2016, 1:09 AM

## 2016-09-17 NOTE — Progress Notes (Signed)
Chaplain Note:  Was referred to family for support. Two sister's,  Patient's son, patient friend and other family members were present. Family indicated their acceptance that patient is, as they described, " brain dead " and would not want to be as she is.  They shared their gratitude for her and her impact on them, and wanted to be able to release her to God.  They asked me to pray for her and for them. I offered a prayer commending her to God and provided time for the family to offer their prayers and feelings about Jody Alvarez.   Nurse indicated that she was waiting for the son to be ready for withdrawal of life support. I told I thought he was probably at that point now.   Kathlyn SacramentoHamilton, Cheria Sadiq E Director Spiritual Care # [phone]

## 2016-09-17 NOTE — Discharge Summary (Signed)
Jody Alvarez:  Alvarez, Jody                ACCOUNT NO.:  [id number]  MEDICAL RECORD NO.:  [id number]  LOCATION:  [id number]                        FACILITY:  [facility]  PHYSICIAN:  Nelda Bucksaniel J Jedrek Dinovo, MD DATE OF BIRTH:  [birth year]  DATE OF ADMISSION:  08/25/2016 DATE OF DISCHARGE:                              DISCHARGE SUMMARY   Death Summary  This is a [age] year old female, found in the bathroom by friends unresponsive with presumed respiratory arrest from heroin overdose leading to cardiac arrest of over 45 minutes duration until return of spontaneous circulation.  The patient had a head CT, which showed cerebral edema.  She on March 12 had a physical examination consistent with brain death.  The son arrived from [place] and provided comfort care. Did not want any further testing such as apnea testing.  The patient's heart stopped and she expired.  FINAL DIAGNOSES UPON DEATH: 1. Opiate overdose. 2. 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 3. Cardiac arrest secondary to respiratory failure.     Nelda Bucksaniel J Dorean Hiebert, MD     DJF/MEDQ  D:  09/04/2016  T:  09/04/2016  Job:  [id number]

## 2016-09-17 NOTE — Progress Notes (Addendum)
PCCM Progress Note  Admission date: 09/06/2016 Referring provider: Dr. Clovis Riley, ER  CC: cardiac arrest  HPI: 61 yo female found in bathroom by friend unresponsive with presumed respiratory leading to cardiac arrest from heroin abuse.  UDS positive for opiates and benzo's.  ROSC after 45 minutes and CT head showed diffuse cerebral edema.  Subjective: No changes in neurostatus She is brain dead on examination  Vital signs: BP (!) 71/41   Pulse 97   Temp 99 F (37.2 C) (Oral)   Resp 18   Wt 64.9 kg (143 lb 1.3 oz)   SpO2 91%   BMI 23.09 kg/m   Intake/output: I/O last 3 completed shifts: In: -  Out: 2345 [Urine:1735; Emesis/NG output:550; Stool:60]  General: comatose, no response to pain Neuro: no corneal, no gag, no cough, no movement to pain, no dolls eyes, not breathing over vent - brain dead HEENT: pupils dilated, fixed Cardiac: s1 s2 RRR Chest: b/l crackles Abd: soft, decreased bowel sounds Ext: no edema Skin: no rashes   CMP Latest Ref Rng & Units 08/27/2016 08/18/2016 09/05/2016  Glucose 65 - 99 mg/dL 981(X) 914(N) 829(F)  BUN 6 - 20 mg/dL 20 19 16   Creatinine 0.44 - 1.00 mg/dL 6.21(H) 0.86(V) 7.84(O)  Sodium 135 - 145 mmol/L 141 138 139  Potassium 3.5 - 5.1 mmol/L 3.9 4.9 5.2(H)  Chloride 101 - 111 mmol/L 116(H) 105 108  CO2 22 - 32 mmol/L 16(L) - 16(L)  Calcium 8.9 - 10.3 mg/dL 7.7(L) - 7.8(L)  Total Protein 6.5 - 8.1 g/dL - - 4.5(L)  Total Bilirubin 0.3 - 1.2 mg/dL - - 0.5  Alkaline Phos 38 - 126 U/L - - 70  AST 15 - 41 U/L - - 96(H)  ALT 14 - 54 U/L - - 32     CBC Latest Ref Rng & Units 08/25/2016 08/30/2016 08/15/2016  WBC 4.0 - 10.5 K/uL - 9.5 9.9  Hemoglobin 12.0 - 15.0 g/dL 10.5(L) 10.9(L) 15.5(H)  Hematocrit 36.0 - 46.0 % 31.0(L) 35.3(L) 45.7  Platelets 150 - 400 K/uL - 178 208     ABG    Component Value Date/Time   PHART 7.012 (LL) 08/21/2016 1744   PCO2ART 69.9 (HH) 08/22/2016 1744   PO2ART 100.0 09/13/2016 1744   HCO3 17.7 (L) 08/22/2016  1744   TCO2 20 08/30/2016 1744   ACIDBASEDEF 14.0 (H) 09/16/2016 1744   O2SAT 93.0 09/12/2016 1744     CBG (last 3)  No results for input(s): GLUCAP in the last 72 hours.   Imaging: Ct Head Wo Contrast  Result Date: 08/18/2016 CLINICAL DATA:  Unresponsive EXAM: CT HEAD WITHOUT CONTRAST TECHNIQUE: Contiguous axial images were obtained from the base of the skull through the vertex without intravenous contrast. COMPARISON:  None. FINDINGS: Brain: There is sulcal effacement, narrowing of the ventricles, and subtle loss of the gray-white differentiation. This appearance raises concern for diffuse cerebral edema. No evidence of parenchymal hemorrhage. No extra axial fluid collection. No CT findings for acute infarction. Crowding of the basilar cisterns (series 7/ image 17). No frank herniation. Subcortical hypodensities in the bilateral frontal lobes (series 7/ image 25), likely reflecting small vessel ischemic changes. Vascular: No hyperdense vessel or unexpected calcification. Skull: Normal. Negative for fracture or focal lesion. Sinuses/Orbits: Mild partial opacification of the bilateral ethmoid and right sphenoid sinuses. Mastoid air cells are clear. Other: None. IMPRESSION: Findings worrisome for diffuse cerebral edema, possibly related to global hypoxic/anoxic event. Crowding of the basilar cisterns, without frank downward herniation at this  time. These results were called by telephone at the time of interpretation on 08/30/2016 at 6:20 pm to Dr. Durenda AgeELIZABETH CAUDILL, who verbally acknowledged these results. Electronically Signed   By: Charline BillsSriyesh  Krishnan M.D.   On: 09/08/2016 18:25   Dg Chest Port 1 View  Result Date: 08/25/2016 CLINICAL DATA:  ETT placement EXAM: PORTABLE CHEST 1 VIEW COMPARISON:  01/03/2016 FINDINGS: Endotracheal tube terminates 5 cm above the carina. Small left pleural effusion. Associated left lower lobe opacity, likely atelectasis. Right lung is clear. No pneumothorax. The heart is  normal in size. Defibrillator pads overlying the left hemithorax. IMPRESSION: Endotracheal tube terminates 5 cm above the carina. Small left pleural effusion. Associated left lower lobe opacity, likely atelectasis. Electronically Signed   By: Charline BillsSriyesh  Krishnan M.D.   On: 09/05/2016 18:26   Dg Abd Portable 1v  Result Date: 08/24/2016 CLINICAL DATA:  OG tube placement EXAM: PORTABLE ABDOMEN - 1 VIEW COMPARISON:  None. FINDINGS: An OG tube is identified with tip overlying the distal stomach. The bowel gas pattern is unremarkable. IMPRESSION: OG tube tip overlying the distal stomach. Electronically Signed   By: Harmon PierJeffrey  Hu M.D.   On: 09/05/2016 23:57    Assessment: Respiratory arrest leading to cardiac arrest 2nd to heroin overdose Anoxic encephalopathy - brain death on examination Acute hypoxic respiratory failure Metabolic acidosis Lactic acidosis Hyperkalemia Acute renal failure Hyperglycemia Anemia of critical illness  Plan: DNR She is brain dead on examination and will require apnea testing if family does not support dc vent No escalation planned Calling son now to update as well O2 needs low, can repeat abg and pre oxygenate if apnea needed Make sure call transplant donor services  Ccm time 30 min   Mcarthur Rossettianiel J. Tyson AliasFeinstein, MD, FACP Pgr: [phone] [place]   Son arrived Does not wish apnea or further rtesting Extensive discussion with family son. We discussed the poor prognosis and likely poor quality of life. Family has decided to offer full comfort care. They are aware that the patient may be transferred to palliative care floor for continued comfort care needs. They have been fully updated on the process and expectations. In afternoon some time will take place Cannot pronounce brain dead so will provide morphine low dose prior Mcarthur RossettiDaniel J. Tyson AliasFeinstein, MD, FACP Pgr: [phone]  Pulmonary & Critical Care

## 2016-09-17 NOTE — Progress Notes (Signed)
Patient passed @ 1635, 2 RNs listened for two minutes, no heart or lung sounds heard. CDS called. CCM aware. Belongings with son, Moise BoringDonnie. Emotional support given to family at bedside.

## 2016-09-17 NOTE — Progress Notes (Signed)
Called CDS per protocol.

## 2016-09-17 NOTE — Progress Notes (Signed)
Nutrition Brief Note  Chart reviewed. Per MD notes not escalation of care planned.  No nutrition interventions planned at this time.  Please consult as needed.   Kendell BaneHeather Erving Sassano RD, LDN, CNSC [phone] Pager [phone] After Hours Pager

## 2016-09-17 NOTE — Procedures (Signed)
Extubation Procedure Note  Patient Details:   Name: Jody HaradaLinda Alvarez DOB: 07/04/1955 MRN: [id number]   Airway Documentation:     Evaluation  O2 sats: currently acceptable Complications: No apparent complications Patient did tolerate procedure well. Bilateral Breath Sounds: Clear   No, Pt terminally weaned to RA.  [name], Jody Magar [facility] 03/17/17, 4:28 PM

## 2016-09-17 DEATH — deceased

## 2016-11-06 ENCOUNTER — Ambulatory Visit: Payer: Self-pay | Admitting: Physician Assistant

## 2018-06-17 IMAGING — CT CT HEAD W/O CM
4 series · 15 of 47 positions shown, 17 images · non-contrast
Comparison: None.

CLINICAL DATA: Unresponsive

EXAM:
CT HEAD WITHOUT CONTRAST
TECHNIQUE: Contiguous axial images were obtained from the base of the skull
through the vertex without intravenous contrast.

[Series 5: head 3.0 mpr cor · coronal · 0.33mm/px · 3 of 67 slices shown]
[im 23/67  brain]
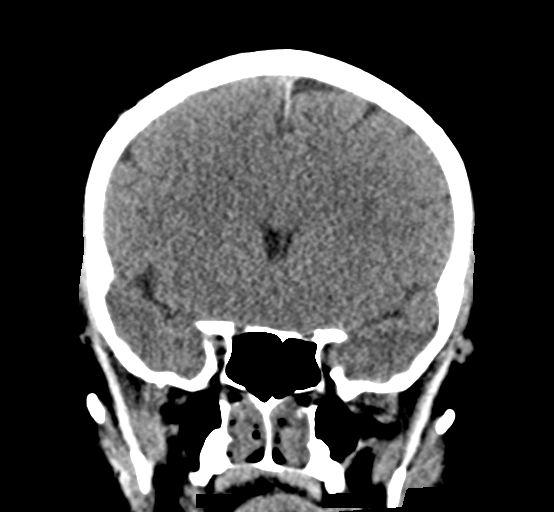
[im 30/67  brain]
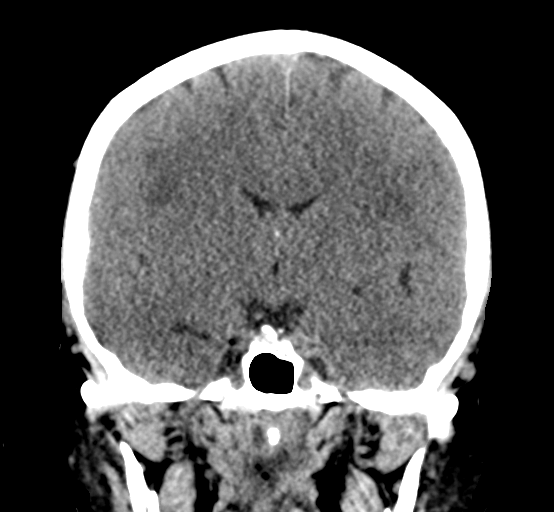
[im 37/67  brain]
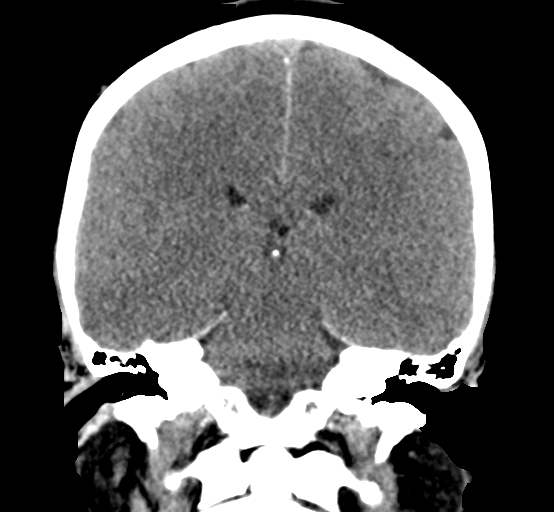

[Series 6: head 3.0 mpr sag · sagittal · 0.34mm/px · 3 of 55 slices shown]
[im 19/55  brain]
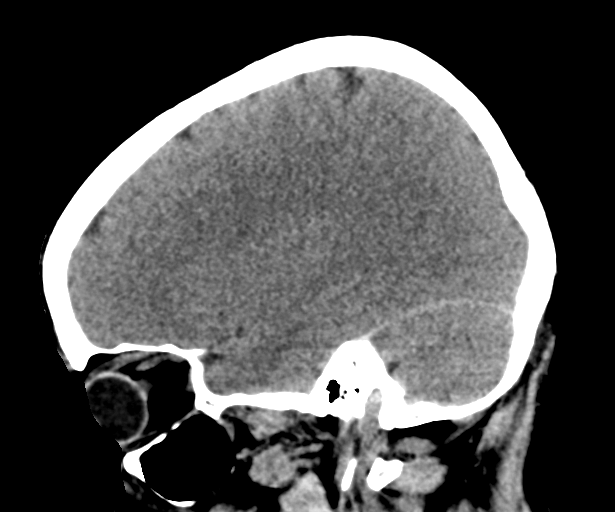
[im 28/55  brain]
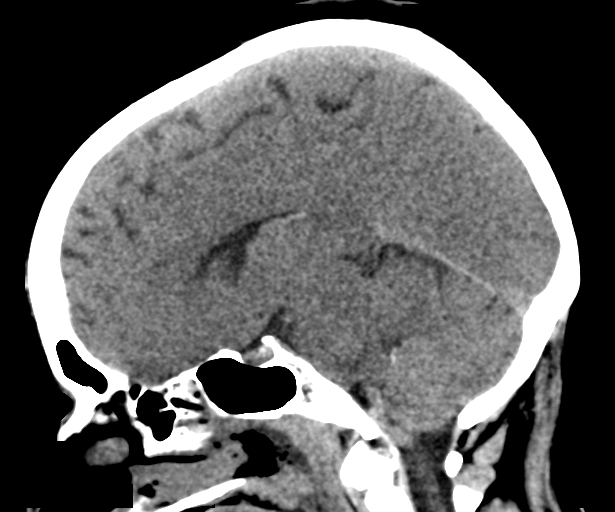
[im 37/55  brain]
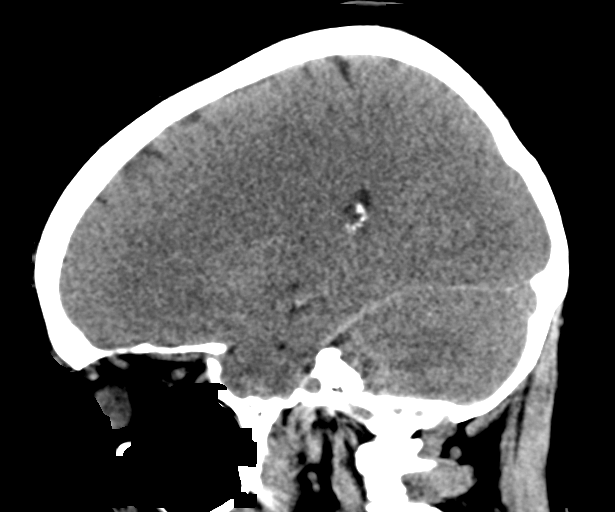

[Series 7: head 5.0 mpr ax · axial · 0.36mm/px · z∈[-115,+25]mm · 7 of 38 slices shown, 9 images]
[im 5/38  brain]
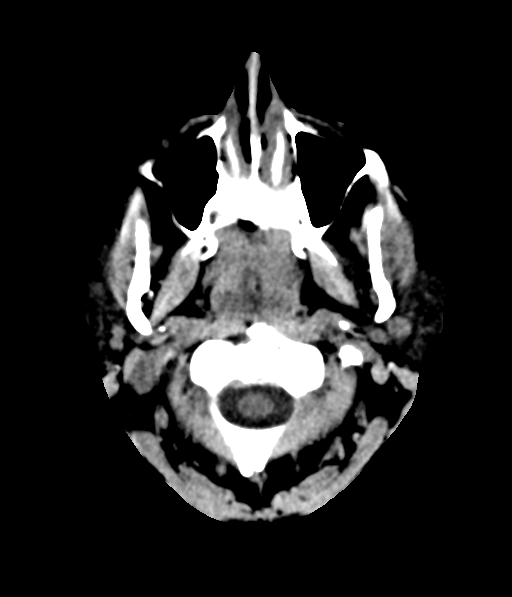
[im 5/38  bone]
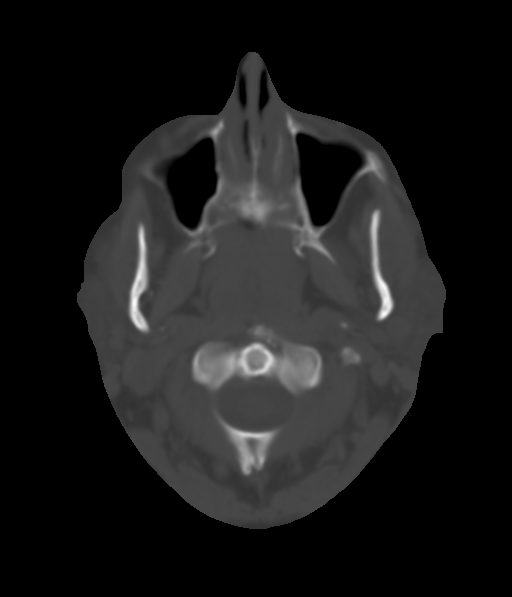
[im 10/38  brain]
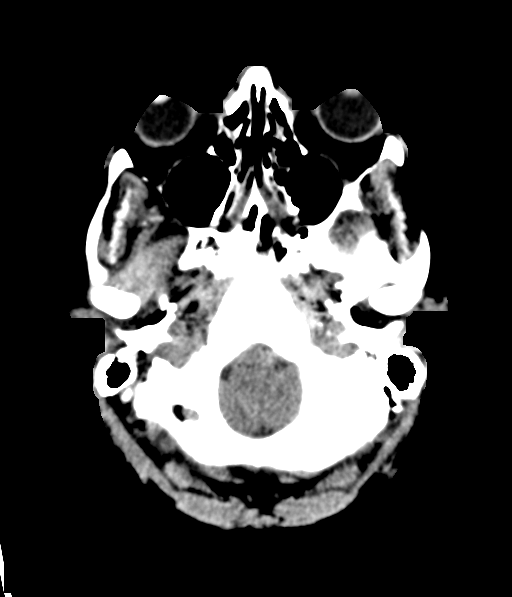
[im 14/38  brain]
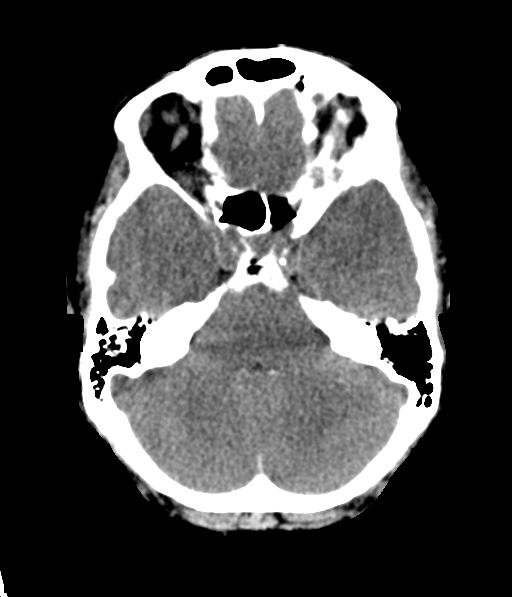
[im 19/38  brain]
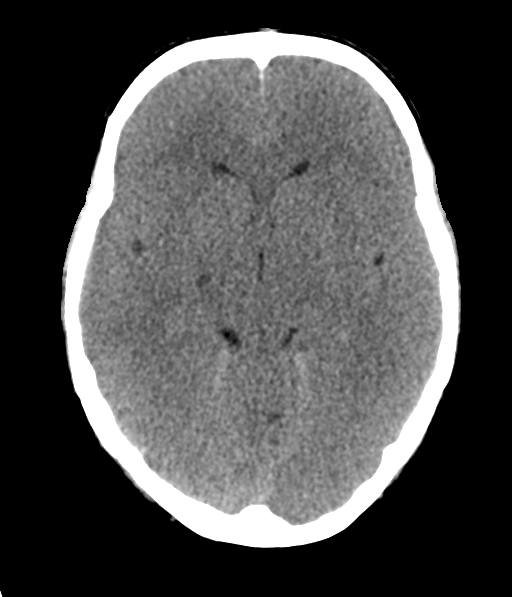
[im 24/38  brain]
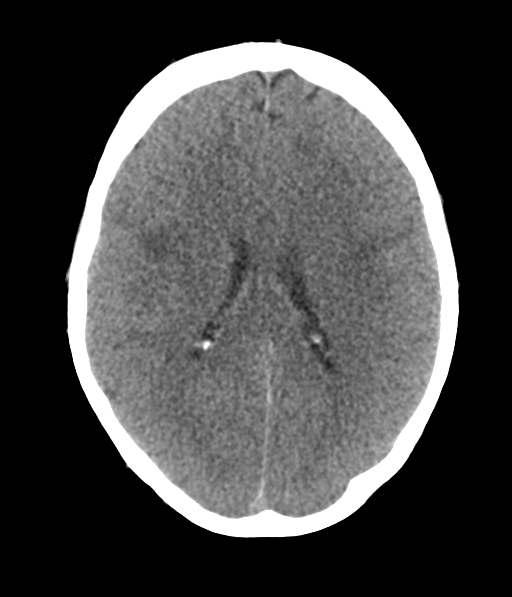
[im 24/38  bone]
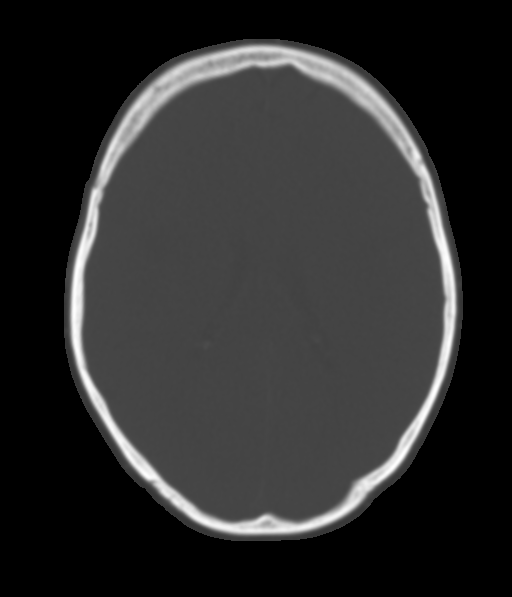
[im 28/38  brain]
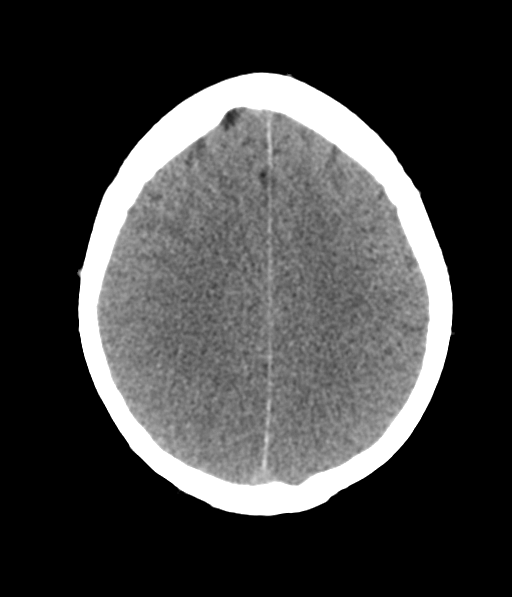
[im 33/38  brain]
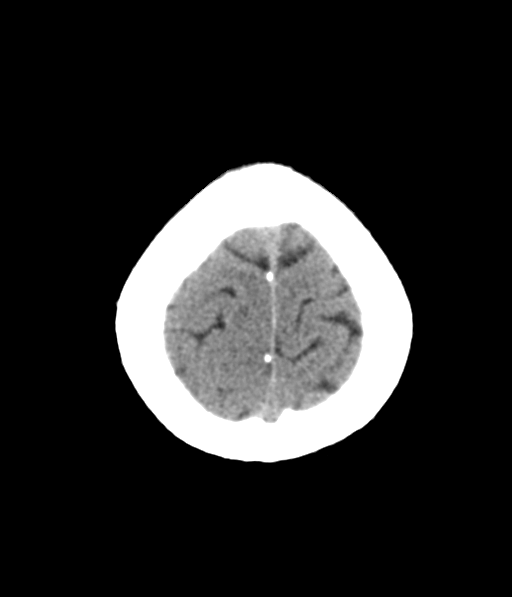

[Series 8: head 2.0 mpr ax bone · axial · 0.36mm/px · z∈[-109,-91]mm · 2 of 95 slices shown]
[im 10/95  bone]
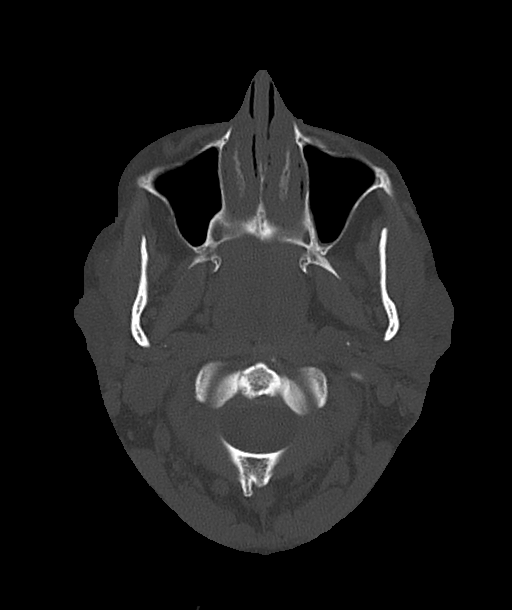
[im 19/95  bone]
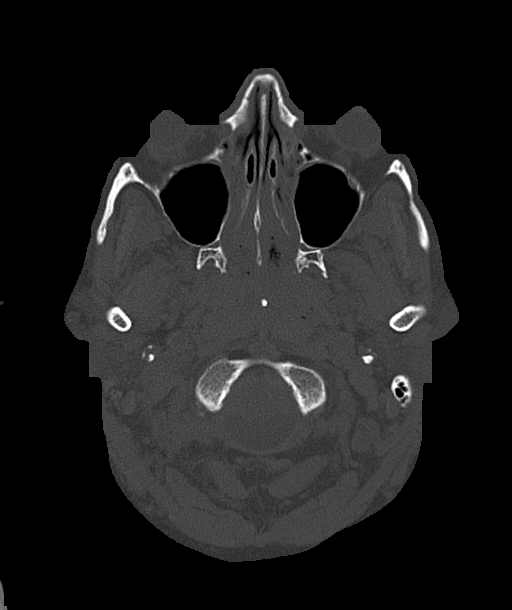

[15 of 47 positions shown; findings below may reference images not displayed]

FINDINGS: Brain: There is sulcal effacement, narrowing of the ventricles, and
subtle loss of the gray-white differentiation. This appearance
raises concern for diffuse cerebral edema.

No evidence of parenchymal hemorrhage. No extra axial fluid
collection.

No CT findings for acute infarction.

Crowding of the basilar cisterns (series 7/ image 17). No frank
herniation.

Subcortical hypodensities in the bilateral frontal lobes (series 7/
image 25), likely reflecting small vessel ischemic changes.

Vascular: No hyperdense vessel or unexpected calcification.

Skull: Normal. Negative for fracture or focal lesion.

Sinuses/Orbits: Mild partial opacification of the bilateral ethmoid
and right sphenoid sinuses. Mastoid air cells are clear.

Other: None.
IMPRESSION: Findings worrisome for diffuse cerebral edema, possibly related to
global hypoxic/anoxic event.

Crowding of the basilar cisterns, without frank downward herniation
at this time.

These results were called by telephone at the time of interpretation
on 08/26/2016 at [DATE] to Dr. MAE KIMURA, who verbally
acknowledged these results.
